# Patient Record
Sex: Female | Born: 1935 | Race: White | Hispanic: No | Smoking: Never smoker
Health system: Southern US, Community
[De-identification: ages and names within clinical notes are randomized; demographics above are authoritative.]

## PROBLEM LIST (undated history)

## (undated) DIAGNOSIS — N183 Chronic kidney disease, stage 3 unspecified: Secondary | ICD-10-CM

## (undated) DIAGNOSIS — R011 Cardiac murmur, unspecified: Secondary | ICD-10-CM

## (undated) DIAGNOSIS — I071 Rheumatic tricuspid insufficiency: Secondary | ICD-10-CM

## (undated) DIAGNOSIS — I10 Essential (primary) hypertension: Secondary | ICD-10-CM

## (undated) DIAGNOSIS — Z9889 Other specified postprocedural states: Secondary | ICD-10-CM

## (undated) DIAGNOSIS — F419 Anxiety disorder, unspecified: Secondary | ICD-10-CM

## (undated) DIAGNOSIS — I4891 Unspecified atrial fibrillation: Secondary | ICD-10-CM

## (undated) DIAGNOSIS — N189 Chronic kidney disease, unspecified: Secondary | ICD-10-CM

## (undated) DIAGNOSIS — R0602 Shortness of breath: Secondary | ICD-10-CM

## (undated) DIAGNOSIS — D649 Anemia, unspecified: Secondary | ICD-10-CM

## (undated) DIAGNOSIS — I272 Pulmonary hypertension, unspecified: Secondary | ICD-10-CM

## (undated) DIAGNOSIS — I447 Left bundle-branch block, unspecified: Secondary | ICD-10-CM

## (undated) DIAGNOSIS — E785 Hyperlipidemia, unspecified: Secondary | ICD-10-CM

## (undated) DIAGNOSIS — I340 Nonrheumatic mitral (valve) insufficiency: Secondary | ICD-10-CM

## (undated) DIAGNOSIS — I509 Heart failure, unspecified: Secondary | ICD-10-CM

## (undated) DIAGNOSIS — Z9981 Dependence on supplemental oxygen: Secondary | ICD-10-CM

## (undated) DIAGNOSIS — M1990 Unspecified osteoarthritis, unspecified site: Secondary | ICD-10-CM

## (undated) DIAGNOSIS — K580 Irritable bowel syndrome with diarrhea: Secondary | ICD-10-CM

## (undated) DIAGNOSIS — Z8679 Personal history of other diseases of the circulatory system: Secondary | ICD-10-CM

## (undated) HISTORY — DX: Pulmonary hypertension, unspecified: I27.20

## (undated) HISTORY — PX: TOTAL ABDOMINAL HYSTERECTOMY W/ BILATERAL SALPINGOOPHORECTOMY: SHX83

## (undated) HISTORY — DX: Rheumatic tricuspid insufficiency: I07.1

## (undated) HISTORY — DX: Anemia, unspecified: D64.9

## (undated) HISTORY — DX: Nonrheumatic mitral (valve) insufficiency: I34.0

## (undated) HISTORY — DX: Hyperlipidemia, unspecified: E78.5

## (undated) HISTORY — DX: Chronic kidney disease, unspecified: N18.9

## (undated) HISTORY — DX: Left bundle-branch block, unspecified: I44.7

## (undated) HISTORY — PX: TONSILLECTOMY: SUR1361

## (undated) HISTORY — DX: Essential (primary) hypertension: I10

## (undated) HISTORY — DX: Heart failure, unspecified: I50.9

## (undated) HISTORY — PX: ABDOMINAL HYSTERECTOMY: SHX81

## (undated) HISTORY — PX: BLADDER SUSPENSION: SHX72

---

## 2002-10-16 HISTORY — PX: REPLACEMENT TOTAL KNEE: SUR1224

## 2005-10-16 HISTORY — PX: JOINT REPLACEMENT: SHX530

## 2011-04-14 DIAGNOSIS — I059 Rheumatic mitral valve disease, unspecified: Secondary | ICD-10-CM

## 2011-10-17 HISTORY — PX: CARDIAC CATHETERIZATION: SHX172

## 2011-10-30 DIAGNOSIS — I4891 Unspecified atrial fibrillation: Secondary | ICD-10-CM

## 2011-10-30 DIAGNOSIS — I059 Rheumatic mitral valve disease, unspecified: Secondary | ICD-10-CM

## 2011-10-31 DIAGNOSIS — I059 Rheumatic mitral valve disease, unspecified: Secondary | ICD-10-CM

## 2011-10-31 DIAGNOSIS — I4891 Unspecified atrial fibrillation: Secondary | ICD-10-CM

## 2011-11-01 DIAGNOSIS — I059 Rheumatic mitral valve disease, unspecified: Secondary | ICD-10-CM

## 2011-11-01 DIAGNOSIS — I079 Rheumatic tricuspid valve disease, unspecified: Secondary | ICD-10-CM

## 2011-11-13 ENCOUNTER — Encounter (HOSPITAL_COMMUNITY): Payer: Self-pay | Admitting: Dentistry

## 2011-11-13 ENCOUNTER — Ambulatory Visit (HOSPITAL_COMMUNITY): Payer: Medicare Other | Admitting: Dentistry

## 2011-11-13 DIAGNOSIS — K08109 Complete loss of teeth, unspecified cause, unspecified class: Secondary | ICD-10-CM

## 2011-11-13 DIAGNOSIS — I340 Nonrheumatic mitral (valve) insufficiency: Secondary | ICD-10-CM | POA: Insufficient documentation

## 2011-11-13 DIAGNOSIS — I059 Rheumatic mitral valve disease, unspecified: Secondary | ICD-10-CM

## 2011-11-13 DIAGNOSIS — Z954 Presence of other heart-valve replacement: Secondary | ICD-10-CM

## 2011-11-13 DIAGNOSIS — M264 Malocclusion, unspecified: Secondary | ICD-10-CM

## 2011-11-13 DIAGNOSIS — I071 Rheumatic tricuspid insufficiency: Secondary | ICD-10-CM | POA: Insufficient documentation

## 2011-11-13 DIAGNOSIS — K036 Deposits [accretions] on teeth: Secondary | ICD-10-CM

## 2011-11-13 DIAGNOSIS — I079 Rheumatic tricuspid valve disease, unspecified: Secondary | ICD-10-CM

## 2011-11-13 NOTE — Progress Notes (Signed)
DENTAL CONSULTATION  Date of Consultation:  11/13/2011 Patient Name:   Tara Silva Date of Birth:   06-13-1934 Medical Record Number: [id number]  VITALS: BP 127/73  Pulse 68  Temp 97 F (36.1 C)   CHIEF COMPLAINT: Patient needs a pre-heart valve surgery dental protocol evaluation.    HPI: Tara Silva is a 78 year old female referred by Dr. Tressie Stalker for a dental consultation. Patient recently diagnosed with severe mitral regurgitation, moderate to severe tricuspid regurgitation, and chronic atrial fibrillation. Patient with anticipated heart valve surgery repair or replacement as indicated along with possible Maze procedure. Patient now seen as part of a pre-heart valve surgery dental protocol evaluation to rule out dental infection that may affect the patient's systemic health and anticipated heart valve surgery.  Patient currently denies acute toothache, swellings, or abscesses. She was last seen by dentist in [facility] (unknown name) for a dental extraction. Patient denies any complications from that dental extraction. Patient had several fillings done at that time.  Patient has no partial dentures. Patient" does not like dentists" .    PMH: Past Medical History  Diagnosis Date  . Valvular disease     Severe Mitral Regurgitation; Tricuspid Regurgitation; Anticipted mitral valve and trucuspid valve repairs in the near future with Dr. Cornelius Moras  . Arrhythmia     Chronic, Persistent Atrial fibrillation  . Congestive heart failure (CHF)   . Pulmonary hypertension   . Hypertension   . Left bundle branch block   . Diabetes mellitus     Type 2  . Anemia     chronic  . Chronic kidney disease     chronic renal insufficiency  . Hyperlipidemia     PSH: Past Surgical History  Procedure Date  . Replacement total knee 2004    Right knee  . Bladder suspension   . Tonsillectomy   . Total abdominal hysterectomy w/ bilateral salpingoophorectomy     ALLERGIES: Allergies    Allergen Reactions  . Aspirin Palpitations  . Sudafed (Pseudoephedrine Hcl) Other (See Comments)    "Scalp crawls"    MEDICATIONS: Current Outpatient Prescriptions  Medication Sig Dispense Refill  . amiodarone (PACERONE) 100 MG tablet Take 100 mg by mouth 2 (two) times daily.       Marland Kitchen atorvastatin (LIPITOR) 40 MG tablet Take 40 mg by mouth at bedtime.       Marland Kitchen b complex vitamins tablet Take 1 tablet by mouth daily.      Marland Kitchen dicyclomine (BENTYL) 20 MG tablet Take 20 mg by mouth every 12 (twelve) hours as needed.       . digoxin (LANOXIN) 0.125 MG tablet Take 125 mcg by mouth daily.      . furosemide (LASIX) 20 MG tablet Take 20 mg by mouth daily.      Marland Kitchen gemfibrozil (LOPID) 600 MG tablet Take 600 mg by mouth 2 (two) times daily before a meal.      . losartan (COZAAR) 100 MG tablet Take 50 mg by mouth daily.       . metoprolol succinate (TOPROL-XL) 100 MG 24 hr tablet Take 100 mg by mouth daily. Take with or immediately following a meal.      . metoprolol succinate (TOPROL-XL) 25 MG 24 hr tablet Take 25 mg by mouth at bedtime.      . polysaccharide iron (NIFEREX) 150 MG CAPS capsule Take 150 mg by mouth daily.      . promethazine (PHENERGAN) 12.5 MG tablet Take 12.5 mg  by mouth as needed. For nausea.      . metFORMIN (GLUCOPHAGE) 1000 MG tablet Take 1,000 mg by mouth.      . spironolactone (ALDACTONE) 25 MG tablet Take 12.5 mg by mouth 2 (two) times daily.       . traMADol (ULTRAM) 50 MG tablet Take 50 mg by mouth every 6 (six) hours as needed.        LABS: No results found for this basename: WBC, HGB, HCT, MCV, PLT   No results found for this basename: na, k, cl, co2, glucose, bun, creatinine, calcium, gfrnonaa, gfraa     SOCIAL HISTORY: History   Social History  . Marital Status: Widowed    Spouse Name: N/A    Number of Children: N/A  . Years of Education: N/A   Occupational History  . Not on file.   Social History Main Topics  . Smoking status: Former Smoker -- 0.5  packs/day for 2 years    Types: Cigarettes    Quit date: 10/17/1923  . Smokeless tobacco: Former Neurosurgeon    Types: Chew    Quit date: 10/16/1990  . Alcohol Use: No  . Drug Use: No  . Sexually Active:    Other Topics Concern  . Not on file   Social History Narrative  . No narrative on file    FAMILY HISTORY: Mother died at age 24 with a heart attack. Father died at the age of 90 from a stroke.   REVIEW OF SYSTEMS: Reviewed from Dr. Orvan July note dated 10/30/2011 from [facility].  DENTAL HISTORY: CHIEF COMPLAINT: Patient needs a pre-heart valve surgery dental protocol evaluation.    HPI: Tara Silva is a 78 year old female referred by Dr. Tressie Stalker for a dental consultation. Patient recently diagnosed with severe mitral regurgitation, moderate to severe tricuspid regurgitation, and chronic atrial fibrillation. Patient with anticipated heart valve surgery repair or replacement as indicated along with possible Maze procedure. Patient now seen as part of a pre-heart valve surgery dental protocol evaluation to rule out dental infection that may affect the patient's systemic health and anticipated heart valve surgery.  Patient currently denies acute toothache, swellings, or abscesses. She was last seen by dentist in [facility] (unknown name) for a dental extraction. Patient denies any complications from that dental extraction. Patient had several fillings done at that time.  Patient has no partial dentures. Patient" does not like dentists" .  DENTAL EXAMINATION:  GENERAL: Patient is a well-developed, slightly build female in no acute distress. HEAD AND NECK: There is no obvious submandibular lymphadenopathy. The patient denies acute TMJ symptoms. Patient does have left TMJ crepitus. INTRAORAL EXAM: Patient has incipient xerostomia. There is no evidence of abscess formation. DENTITION: Patient with multiple missing teeth numbers 1, 2, 4, 13, 14, 16, 18, 19,  30, and 32. PERIODONTAL: Patient with chronic periodontitis with plaque and calculus accumulations, generalized gingival recession, and incipient tooth mobility of multiple teeth as charted. DENTAL CARIES/SUBOPTIMAL RESTORATIONS: Patient has dental caries affect and tooth #17. The caries are very close to the pulp. ENDODONTIC: Patient denies acute pulpitis symptoms. Do not see any evidence of periapical pathology . CROWN AND BRIDGE: Patient has crown on tooth #3 that appears to be acceptable. PROSTHODONTIC: Patient denies presents a partial dentures. OCCLUSION: Patient has a poor occlusal scheme but a stable occlusion at this time. Patient has supra-eruption and drifting of the unopposed teeth into the edentulous areas.  RADIOGRAPHIC INTERPRETATION: A panoramic x-ray  was taken and supplemented with 13 periapical radiographs and to bitewings. There are multiple missing teeth. There is supra-eruption and drifting of the unopposed teeth into the edentulous areas. There is radiographic calculus noted. There are dental caries noted be affect and tooth #17.   ASSESSMENTS: 1. Chronic periodontitis with accretions. 2. Extensive dental caries affecting tooth #17 with close proximity to the pulp. 3. Moderate bone loss 4. Generalized gingival recession 5. No history of partial dentures 6. Supra-eruption and drifting of the unopposed teeth into the edentulous areas. 7. Poor occlusal scheme 8. Xerostomia 9. Significant cardiovascular compromise with potential risks for complications with invasive dental procedures 10 . Need to determine the need for antibiotic premedication prior to invasive dental procedures due to heart valve disease as well as history of total knee replacement.  PLAN/RECOMMENDATIONS: 1. I discussed the risks, benefits, and complications of various treatment options with the patient in relationship to the medical and dental conditions. We discussed various treatment options to include  no treatment, singular or multiple extractions with alveoloplasty, pre-prosthetic surgery as indicated, periodontal therapy, dental restorations, root canal therapy, crown and bridge therapy, implant therapy, and replacement of missing teeth as indicated. The patient currently wishes to proceed with referral to an Oral Surgeon for extraction of tooth #17 in [place], [place]. Patient will then return for periodontal therapy with Dental medicine. 2. Discussion of findings with medical and dental team and coordination of future medical and dental care.   Charlynne Pander, DDS

## 2011-11-20 ENCOUNTER — Encounter (HOSPITAL_COMMUNITY): Payer: Self-pay | Admitting: Dentistry

## 2011-11-20 ENCOUNTER — Ambulatory Visit (HOSPITAL_COMMUNITY): Payer: Self-pay | Admitting: Dentistry

## 2011-11-20 DIAGNOSIS — K036 Deposits [accretions] on teeth: Secondary | ICD-10-CM

## 2011-11-20 DIAGNOSIS — K0530 Chronic periodontitis, unspecified: Secondary | ICD-10-CM

## 2011-11-20 NOTE — Progress Notes (Signed)
Monday, November 20, 2011   BP: 135/77             P:  68           T: 97.1  Tara Silva presents for gross debridement of remaining teeth. Patient forgot to take antibiotic premedication. Patient is scheduled for extraction tomorrow with Dr. Beather Arbour. Patient to call their office and make sure what antibiotics she is to take for the appointment. Patient rescheduled for gross debridement next Tuesday with amoxicillin premedication.  Dr. Cindra Eves

## 2011-11-28 ENCOUNTER — Ambulatory Visit (HOSPITAL_COMMUNITY): Payer: Self-pay | Admitting: Dentistry

## 2011-11-28 DIAGNOSIS — K0530 Chronic periodontitis, unspecified: Secondary | ICD-10-CM

## 2011-11-28 DIAGNOSIS — K036 Deposits [accretions] on teeth: Secondary | ICD-10-CM

## 2011-11-28 DIAGNOSIS — K08199 Complete loss of teeth due to other specified cause, unspecified class: Secondary | ICD-10-CM

## 2011-11-28 NOTE — Progress Notes (Signed)
Tuesday, November 28, 2011   BP: 143/67          P: 71           T: 97.1  Tara Silva present for gross debridement of remaining teeth prior to anticipated mitral valve replacement. Patient had tooth #17 extracted with the oral surgeon on Tuesday, 11/21/2011. This was with Dr. Beather Arbour. Patient denies any problems with the extraction site. Patient denies having any sutures remaining. Patient took amoxicillin 2.0 g 1 hour prior to dental appointment.  Exam: Patient with evidence of gross plaque and calculus accumulations. Extraction site #17 is healing in well. Clot is present. No sutures remain. No signs of oral infection.  Procedure: Antibiotic verification form filled out. Patient agrees to proceed with gross debridement procedure today. Gross debridement with sonic scaler. Selective hand curettes were utilized to remove further accretions. Sonic scaler then again used to refine removal of accretions. Oral hygiene instructions were provided. Teeth were polished. Teeth were flossed. Postop rinse with chlorhexidine for 30 seconds. Patient tolerated procedure well. Patient is now cleared for heart valve surgery. Patient is to follow up with Dr. Laneta Simmers for evaluation for anticipated heart valve surgery. Patient dismissed in stable condition to the care of her daughter-in-law.   Dr. Cindra Eves

## 2011-12-06 ENCOUNTER — Ambulatory Visit (INDEPENDENT_AMBULATORY_CARE_PROVIDER_SITE_OTHER): Payer: Medicare Other | Admitting: Surgery

## 2011-12-06 DIAGNOSIS — I38 Endocarditis, valve unspecified: Secondary | ICD-10-CM

## 2011-12-07 ENCOUNTER — Encounter: Payer: Self-pay | Admitting: Surgery

## 2011-12-07 NOTE — Progress Notes (Signed)
Patient ID: Tara Silva, female   DOB: 06-28-1934, 78 y.o.   MRN: [id number]   I reviewed her chart and prior consult note from Dr. Cornelius Moras, as well as her echo and cath.  I agree with Dr. Orvan July assessment that she needs MV repair or replacement, TV repair, MAZE and probably LV epicardial pacing leads.  She is a high risk surgical case due to biventricular dysfunction and chronic renal disease but says she feels fairly good at this time and would like to proceed with surgery while she feels well.  I told her that I thought she should have the surgery done at [facility] by Dr. Cornelius Moras who has worked the patient up thoroughly already.  She will benefit from the ability to do CVVHD if she has kidney failure, pulmonary/CCM support if needed, ability to use NO, and availability of LV/RV support device if needed. I discussed all of this with the patient and her daughter and they both agree with returning to see Dr. Cornelius Moras in the office and having surgery at [facility].

## 2011-12-18 ENCOUNTER — Encounter: Payer: Self-pay | Admitting: Thoracic Surgery (Cardiothoracic Vascular Surgery)

## 2011-12-18 ENCOUNTER — Ambulatory Visit (INDEPENDENT_AMBULATORY_CARE_PROVIDER_SITE_OTHER): Payer: Medicare Other | Admitting: Thoracic Surgery (Cardiothoracic Vascular Surgery)

## 2011-12-18 DIAGNOSIS — I059 Rheumatic mitral valve disease, unspecified: Secondary | ICD-10-CM

## 2011-12-18 DIAGNOSIS — I509 Heart failure, unspecified: Secondary | ICD-10-CM

## 2011-12-18 DIAGNOSIS — I079 Rheumatic tricuspid valve disease, unspecified: Secondary | ICD-10-CM

## 2011-12-18 DIAGNOSIS — I4891 Unspecified atrial fibrillation: Secondary | ICD-10-CM

## 2011-12-18 DIAGNOSIS — D649 Anemia, unspecified: Secondary | ICD-10-CM | POA: Insufficient documentation

## 2011-12-18 DIAGNOSIS — I272 Pulmonary hypertension, unspecified: Secondary | ICD-10-CM | POA: Insufficient documentation

## 2011-12-18 DIAGNOSIS — I071 Rheumatic tricuspid insufficiency: Secondary | ICD-10-CM

## 2011-12-18 DIAGNOSIS — I482 Chronic atrial fibrillation, unspecified: Secondary | ICD-10-CM | POA: Insufficient documentation

## 2011-12-18 DIAGNOSIS — I340 Nonrheumatic mitral (valve) insufficiency: Secondary | ICD-10-CM

## 2011-12-18 DIAGNOSIS — E119 Type 2 diabetes mellitus without complications: Secondary | ICD-10-CM | POA: Insufficient documentation

## 2011-12-18 DIAGNOSIS — I447 Left bundle-branch block, unspecified: Secondary | ICD-10-CM

## 2011-12-18 DIAGNOSIS — I2789 Other specified pulmonary heart diseases: Secondary | ICD-10-CM

## 2011-12-18 DIAGNOSIS — N189 Chronic kidney disease, unspecified: Secondary | ICD-10-CM | POA: Insufficient documentation

## 2011-12-18 NOTE — Progress Notes (Signed)
[address]            [name] [address]          [phone]     CARDIOTHORACIC SURGERY CONSULTATION REPORT  PCP is Jolene Provost, MD, MD Referring Provider is Merrily Pew, MD  Chief Complaint  Patient presents with  . Follow-up    Further discuss surgery    HPI:  Patient is 78 year old recently widowed white female from [place] with long-standing history of mitral regurgitation, atrial fibrillation, and congestive heart failure. Patient states that she was first noted to have an irregular heart rhythm at least 8 years ago when she presented with severe anemia and congestive heart failure. In May of 2009 she was hospitalized with congestive heart failure and rapid atrial fibrillation. Echocardiogram performed at that time confirming the presence of mitral regurgitation with normal left ventricular systolic function. Since then the patient has developed progressive worsening symptoms of exertional shortness of breath and fatigue. In June 2012 she was hospitalized in [facility] with class IV congestive heart failure and respiratory failure. Echocardiogram performed at that time demonstrated severe global left ventricular dysfunction with severe mitral regurgitation and moderate tricuspid regurgitation. The patient underwent left and right heart catheterization demonstrating normal coronary artery anatomy with no significant coronary artery disease. The patient was seen in consultation at that time by Dr. Erby Pian and told that she probably would not tolerate surgery under the circumstances. The patient was treated medically and did reasonably well for a period of time, but she was rehospitalized in January 2013 with rapid atrial fibrillation class 4 congestive heart failure and pulmonary edema. I had the opportunity to see the patient in consultation on 10/30/2011 during that hospitalization. She had transesophageal echocardiogram performed on  11/01/2011. This revealed severe left ventricular dysfunction with severe mitral regurgitation and moderate to severe tricuspid regurgitation. Right heart catheterization was performed at that time demonstrating pulmonary artery pressure 33/17 with pulmonary catheter wedge pressure mean of 15 and large V waves. Cardiac output ranged between 3.1 and 3.4 L per minute corresponding to a cardiac index between 1.9 and 2.0. Mixed venous oxygen saturation was 55%. Central venous pressure was 5. Since then the patient has remained clinically stable from a cardiovascular standpoint. She underwent dental extraction a dental cleaning. She now desires to proceed with elective surgery.  Past Medical History  Diagnosis Date  . Congestive heart failure (CHF)     chronic systolic and diastolic  . Pulmonary hypertension   . Hypertension   . Left bundle branch block   . Diabetes mellitus     Type 2  . Anemia     chronic  . Chronic kidney disease     chronic renal insufficiency  . Hyperlipidemia   . Mitral regurgitation   . Tricuspid regurgitation   . Atrial fibrillation, chronic   . CHF (congestive heart failure) 12/18/2011    Past Surgical History  Procedure Date  . Replacement total knee 2004    Right knee  . Bladder suspension   . Tonsillectomy   . Total abdominal hysterectomy w/ bilateral salpingoophorectomy     History reviewed. No pertinent family history.  Social History History  Substance Use Topics  . Smoking status: Former Smoker -- 0.5 packs/day for 2 years    Types: Cigarettes    Quit date: 10/17/1923  . Smokeless tobacco: Former Neurosurgeon  Types: Dorna Bloom    Quit date: 10/16/1990  . Alcohol Use: No    Current Outpatient Prescriptions  Medication Sig Dispense Refill  . ALPRAZolam (XANAX) 0.25 MG tablet Take 0.25 mg by mouth at bedtime as needed.       Marland Kitchen amiodarone (PACERONE) 100 MG tablet Take 200 mg by mouth daily.       Marland Kitchen atorvastatin (LIPITOR) 40 MG tablet Take 40 mg by mouth at  bedtime.       Marland Kitchen b complex vitamins tablet Take 1 tablet by mouth daily.      Marland Kitchen dicyclomine (BENTYL) 20 MG tablet Take 20 mg by mouth every 12 (twelve) hours as needed.       . digoxin (LANOXIN) 0.125 MG tablet Take 125 mcg by mouth daily.      . furosemide (LASIX) 20 MG tablet Take 20 mg by mouth daily.      Marland Kitchen gemfibrozil (LOPID) 600 MG tablet Take 600 mg by mouth 2 (two) times daily before a meal.      . losartan (COZAAR) 100 MG tablet Take 50 mg by mouth daily.       . metoprolol succinate (TOPROL-XL) 100 MG 24 hr tablet Take 100 mg by mouth daily. Take with or immediately following a meal.      . metoprolol succinate (TOPROL-XL) 25 MG 24 hr tablet Take 25 mg by mouth at bedtime.      . polysaccharide iron (NIFEREX) 150 MG CAPS capsule Take 150 mg by mouth daily.      . promethazine (PHENERGAN) 12.5 MG tablet Take 12.5 mg by mouth as needed. For nausea.      . traMADol (ULTRAM) 50 MG tablet Take 50 mg by mouth every 6 (six) hours as needed.        Allergies  Allergen Reactions  . Aspirin Palpitations  . Sudafed (Pseudoephedrine Hcl) Other (See Comments)    "Scalp crawls"    Review of Systems:  General:  decreased appetite, normal energy   Respiratory:  no cough, no wheezing, no hemoptysis, no pain with inspiration or cough, no shortness of breath  Cardiac:   no chest pain or tightness, + exertional SOB, no resting SOB, no PND, + orthopnea, no LE edema, no palpitations, no syncope  GI:   no difficulty swallowing, no hematochezia, no hematemesis, no melena, + constipation, no diarrhea   GU:   no dysuria, no urgency, + frequency   Musculoskeletal: + arthritis, mild, ambulates well without assistance  Vascular:  no pain suggestive of claudication   Neuro:   no symptoms suggestive of TIA's, no seizures, no headaches, no peripheral neuropathy   Endocrine:  Negative, blood sugars reportedly well controlled  HEENT:  no loose teeth or painful teeth,  no recent vision changes  Psych:   +  anxiety, no depression    Physical Exam:   BP 158/71  Pulse 71  Resp 18  Ht 5\' 2"  (1.575 m)  Wt 149 lb (67.586 kg)  BMI 27.25 kg/m2  SpO2 99%  General:    well-appearing  HEENT:  Unremarkable   Neck:   no JVD, no bruits, no adenopathy   Chest:   clear to auscultation, symmetrical breath sounds, no wheezes, no rhonchi   CV:   RRR, grade II/VI systolic murmur   Abdomen:  soft, non-tender, no masses   Extremities:  warm, well-perfused, pulses diminished, no LE edema  Rectal/GU  Deferred  Neuro:   Grossly non-focal and symmetrical throughout  Skin:  Clean and dry, no rashes, no breakdown   Diagnostic Tests:  n/a   Impression:  Patient has severe mitral regurgitation with long-standing chronic persistent atrial fibrillation and chronic systolic and diastolic congestive heart failure. Left ventricular function with moderate to severely impaired. The patient has pulmonary a tension as well as moderate to severe tricuspid regurgitation. She has been remarkably stable over the past 6 weeks since her most recent hospitalization.  Plan:  The rationale for elective mitral valve repair surgery has been explained, including a comparison between surgery and continued medical therapy with close follow-up.  The likelihood of successful and durable valve repair has been discussed with particular reference to the findings of their recent echocardiogram.  Based upon these findings and previous experience, I have quoted them a greater than 80 percent likelihood of successful valve repair.  In the unlikely event that their valve cannot be successfully repaired, we discussed the possibility of replacing the mitral valve using a mechanical prosthesis with the attendant need for long-term anticoagulation versus the alternative of replacing it using a bioprosthetic tissue valve with its potential for late structural valve deterioration and failure, depending upon the patient's longevity.  The patient  specifically requests that if the mitral valve must be replaced that it be done using a bioprosthetic tissue valve.  The need for tricuspid valve repair has also been discussed, as has the rationale for maze procedure and placement of LV epicardial permanent pacemaker leads as been reviewed.  The patient understands and accepts all potential associated risks of surgery including but not limited to risk of death, stroke, myocardial infarction, congestive heart failure, respiratory failure, renal failure, bleeding requiring blood transfusion and/or reexploration, arrhythmia, heart block or bradycardia requiring permanent pacemaker, pneumonia, pleural effusion, wound infection, pulmonary embolus or other thromboembolic complication, chronic pain or other delayed complications.  All questions answered.  We tentatively plan for surgery on Thursday January 04, 2012.  We will see her back here in the office on Monday January 01, 2012.     Salvatore Decent. Cornelius Moras, MD 12/18/2011 4:26 PM

## 2011-12-19 ENCOUNTER — Other Ambulatory Visit: Payer: Self-pay | Admitting: Thoracic Surgery (Cardiothoracic Vascular Surgery)

## 2011-12-19 ENCOUNTER — Other Ambulatory Visit: Payer: Self-pay

## 2011-12-19 DIAGNOSIS — I079 Rheumatic tricuspid valve disease, unspecified: Secondary | ICD-10-CM

## 2011-12-19 DIAGNOSIS — I4891 Unspecified atrial fibrillation: Secondary | ICD-10-CM

## 2011-12-19 DIAGNOSIS — I059 Rheumatic mitral valve disease, unspecified: Secondary | ICD-10-CM

## 2011-12-29 ENCOUNTER — Encounter (HOSPITAL_COMMUNITY): Payer: Self-pay | Admitting: Pharmacy Technician

## 2011-12-29 ENCOUNTER — Other Ambulatory Visit (HOSPITAL_COMMUNITY): Payer: Self-pay | Admitting: *Deleted

## 2012-01-01 ENCOUNTER — Encounter: Payer: Self-pay | Admitting: Thoracic Surgery (Cardiothoracic Vascular Surgery)

## 2012-01-01 ENCOUNTER — Encounter (HOSPITAL_COMMUNITY): Payer: Self-pay

## 2012-01-01 ENCOUNTER — Ambulatory Visit (HOSPITAL_COMMUNITY)
Admission: RE | Admit: 2012-01-01 | Discharge: 2012-01-01 | Disposition: A | Discharge status: Home / Self Care | Payer: Medicare Other | Source: Ambulatory Visit | Attending: Thoracic Surgery (Cardiothoracic Vascular Surgery) | Admitting: Thoracic Surgery (Cardiothoracic Vascular Surgery)

## 2012-01-01 ENCOUNTER — Encounter (HOSPITAL_COMMUNITY)
Admission: RE | Admit: 2012-01-01 | Discharge: 2012-01-01 | Disposition: A | Discharge status: Home / Self Care | Payer: Medicare Other | Source: Ambulatory Visit | Attending: Thoracic Surgery (Cardiothoracic Vascular Surgery) | Admitting: Thoracic Surgery (Cardiothoracic Vascular Surgery)

## 2012-01-01 ENCOUNTER — Ambulatory Visit (INDEPENDENT_AMBULATORY_CARE_PROVIDER_SITE_OTHER): Payer: Medicare Other | Admitting: Thoracic Surgery (Cardiothoracic Vascular Surgery)

## 2012-01-01 ENCOUNTER — Inpatient Hospital Stay (HOSPITAL_COMMUNITY)
Admission: RE | Admit: 2012-01-01 | Discharge: 2012-01-01 | Disposition: A | Discharge status: Home / Self Care | Payer: Medicare Other | Source: Ambulatory Visit | Attending: Thoracic Surgery (Cardiothoracic Vascular Surgery) | Admitting: Thoracic Surgery (Cardiothoracic Vascular Surgery)

## 2012-01-01 ENCOUNTER — Other Ambulatory Visit: Payer: Self-pay

## 2012-01-01 ENCOUNTER — Other Ambulatory Visit (HOSPITAL_COMMUNITY): Payer: Self-pay

## 2012-01-01 VITALS — HR 57 | Temp 96.7°F | Resp 20 | Ht 62.0 in | Wt 147.5 lb

## 2012-01-01 VITALS — BP 145/78 | HR 64 | Resp 18 | Ht 62.0 in | Wt 147.0 lb

## 2012-01-01 DIAGNOSIS — I059 Rheumatic mitral valve disease, unspecified: Secondary | ICD-10-CM

## 2012-01-01 DIAGNOSIS — I079 Rheumatic tricuspid valve disease, unspecified: Secondary | ICD-10-CM

## 2012-01-01 DIAGNOSIS — I4891 Unspecified atrial fibrillation: Secondary | ICD-10-CM | POA: Insufficient documentation

## 2012-01-01 DIAGNOSIS — Z01812 Encounter for preprocedural laboratory examination: Secondary | ICD-10-CM | POA: Insufficient documentation

## 2012-01-01 DIAGNOSIS — I2789 Other specified pulmonary heart diseases: Secondary | ICD-10-CM

## 2012-01-01 DIAGNOSIS — Z01818 Encounter for other preprocedural examination: Secondary | ICD-10-CM | POA: Insufficient documentation

## 2012-01-01 DIAGNOSIS — I509 Heart failure, unspecified: Secondary | ICD-10-CM | POA: Insufficient documentation

## 2012-01-01 DIAGNOSIS — Z01810 Encounter for preprocedural cardiovascular examination: Secondary | ICD-10-CM

## 2012-01-01 DIAGNOSIS — I447 Left bundle-branch block, unspecified: Secondary | ICD-10-CM

## 2012-01-01 HISTORY — DX: Dependence on supplemental oxygen: Z99.81

## 2012-01-01 HISTORY — DX: Anxiety disorder, unspecified: F41.9

## 2012-01-01 HISTORY — DX: Shortness of breath: R06.02

## 2012-01-01 HISTORY — DX: Unspecified osteoarthritis, unspecified site: M19.90

## 2012-01-01 HISTORY — DX: Unspecified atrial fibrillation: I48.91

## 2012-01-01 HISTORY — DX: Chronic kidney disease, stage 3 (moderate): N18.3

## 2012-01-01 HISTORY — DX: Cardiac murmur, unspecified: R01.1

## 2012-01-01 HISTORY — DX: Chronic kidney disease, stage 3 unspecified: N18.30

## 2012-01-01 HISTORY — DX: Irritable bowel syndrome with diarrhea: K58.0

## 2012-01-01 LAB — COMPREHENSIVE METABOLIC PANEL
ALT: 10 U/L (ref 0–35)
AST: 15 U/L (ref 0–37)
Albumin: 4.1 g/dL (ref 3.5–5.2)
Calcium: 9.8 mg/dL (ref 8.4–10.5)
Chloride: 101 mEq/L (ref 96–112)
Creatinine, Ser: 1.02 mg/dL (ref 0.50–1.10)
Sodium: 136 mEq/L (ref 135–145)

## 2012-01-01 LAB — BLOOD GAS, ARTERIAL
Acid-base deficit: 1.1 mmol/L (ref 0.0–2.0)
Drawn by: 206361
FIO2: 0.21 %
pCO2 arterial: 40.1 mmHg (ref 35.0–45.0)
pO2, Arterial: 88.5 mmHg (ref 80.0–100.0)

## 2012-01-01 LAB — CBC
MCH: 31.5 pg (ref 26.0–34.0)
MCV: 95.6 fL (ref 78.0–100.0)
Platelets: 221 10*3/uL (ref 150–400)
RBC: 3.87 MIL/uL (ref 3.87–5.11)
RDW: 13 % (ref 11.5–15.5)
WBC: 10.8 10*3/uL — ABNORMAL HIGH (ref 4.0–10.5)

## 2012-01-01 LAB — SURGICAL PCR SCREEN: MRSA, PCR: NEGATIVE

## 2012-01-01 LAB — URINALYSIS, ROUTINE W REFLEX MICROSCOPIC
Bilirubin Urine: NEGATIVE
Glucose, UA: NEGATIVE mg/dL
Hgb urine dipstick: NEGATIVE
Ketones, ur: NEGATIVE mg/dL
Protein, ur: NEGATIVE mg/dL

## 2012-01-01 LAB — APTT: aPTT: 36 seconds (ref 24–37)

## 2012-01-01 LAB — PROTIME-INR: Prothrombin Time: 14.7 seconds (ref 11.6–15.2)

## 2012-01-01 NOTE — Pre-Procedure Instructions (Signed)
[address] ELIF YONTS  01/01/2012   Your procedure is scheduled on:  Thursday, March 21  Report to [facility] at 0530 AM.  Call this number if you have problems the morning of surgery: [phone]   Remember:   Do not eat food:After Midnight.  May have clear liquids: up to 4 Hours before arrival.  Clear liquids include soda, tea, black coffee, apple or grape juice, broth.  Take these medicines the morning of surgery with A SIP OF WATER: *Digoxin, Metoprolol**   Do not wear jewelry, make-up or nail polish.  Do not wear lotions, powders, or perfumes. You may wear deodorant.  Do not shave 48 hours prior to surgery.  Do not bring valuables to the hospital.  Contacts, dentures or bridgework may not be worn into surgery.  Leave suitcase in the car. After surgery it may be brought to your room.  For patients admitted to the hospital, checkout time is 11:00 AM the day of discharge.   Patients discharged the day of surgery will not be allowed to drive home.     Special Instructions: Incentive Spirometry - Practice and bring it with you on the day of surgery. and CHG Shower Use Special Wash: 1/2 bottle night before surgery and 1/2 bottle morning of surgery.   Please read over the following fact sheets that you were given: Pain Booklet, Coughing and Deep Breathing, Blood Transfusion Information, Open Heart Packet, MRSA Information and Surgical Site Infection Prevention

## 2012-01-01 NOTE — Consult Note (Signed)
Anesthesia:  Patient is a 78 year old female scheduled for MV repair/replacement, TV repair, Maze procedure, placement of epicardial PM leads on 01/04/12.  She has severe MR, afib, chronic systolic and diastolic CHF (EF 40%), moderately to severely impaired LV function, mild pulmonary HTN, and moderate - severe TR.  Other history includes HTN, former smoker, anemia, HLD, CKD stage III, left BBB, DM2, IBS, anxiety, SOB, arthritis, prior use of supplemental O2.  Her PCP is Dr. Dewain Penning.  Her Cardiologist is Dr. Merrily Pew with [facility].  Her EKG today shows afib at 65 bpm, left BBB, LAD.  Rate is slower, but overall stable since 10/26/11.    Heart caths were done at [facility] with results as follows (per Dr. Orvan July H&P note; copies on chart for review):  Left and right heart catheterization performed 04/14/2011 is reviewed. This demonstrates normal coronary artery anatomy with no significant coronary artery disease. Right heart catheterization at that time demonstrated pulmonary artery pressure 38/13 with pulmonary capillary wedge pressure 24 a large V waves. Central venous pressure was 13 with mixed venous oxygen saturation of only 46.5%. Cardiac output ranged between 3.7 3.9 L per minute. Repeat right heart catheterization was performed in January 2013 revealed pulmonary artery pressure 33/17 with pulmonary catheter wedge pressure mean of 15 and large V waves. Cardiac output ranged between 3.1 and 3.4 L per minute corresponding to a cardiac index between 1.9 and 2.0. Mixed venous oxygen saturation was 55%. Central venous pressure was 5.   TEE on 11/01/11 showed severe MR, mod-severe TR, normal LA appendage, mild spontaneous echo contrast was present in LA appendage, moderately dilated LA.  2D echo on 10/26/11 showed bi-atrial enlargement, LVH with mild global hypokinesis and sepal dyskinesis, LVEF 45%, severe MR, mod-severe TR, mild PR with mild pulmonary HTN, no  pericardial effusion.  CXR and labs acceptable from an Anesthesia standpoint.  Anticipate she can proceed as planned if no significant change in her status.

## 2012-01-01 NOTE — H&P (Signed)
CARDIOTHORACIC SURGERY HISTORY AND PHYSICAL EXAM  PCP is Jolene Provost, MD, MD Referring Provider is Merrily Pew, MD    Chief Complaint   Patient presents with   .  Follow-up       Further discuss surgery     HPI:  Patient is 78 year old recently widowed white female from [place] with long-standing history of mitral regurgitation, atrial fibrillation, and congestive heart failure. Patient states that she was first noted to have an irregular heart rhythm at least 8 years ago when she presented with severe anemia and congestive heart failure. In May of 2009 she was hospitalized with congestive heart failure and rapid atrial fibrillation. Echocardiogram performed at that time confirming the presence of mitral regurgitation with normal left ventricular systolic function. Since then the patient has developed progressive worsening symptoms of exertional shortness of breath and fatigue. In June 2012 she was hospitalized in [facility] with class IV congestive heart failure and respiratory failure. Echocardiogram performed at that time demonstrated severe global left ventricular dysfunction with severe mitral regurgitation and moderate tricuspid regurgitation. The patient underwent left and right heart catheterization demonstrating normal coronary artery anatomy with no significant coronary artery disease. The patient was seen in consultation at that time by Dr. Erby Pian and told that she probably would not tolerate surgery under the circumstances. The patient was treated medically and did reasonably well for a period of time, but she was rehospitalized in January 2013 with rapid atrial fibrillation class 4 congestive heart failure and pulmonary edema. I had the opportunity to see the patient in consultation on 10/30/2011 during that hospitalization. She had transesophageal echocardiogram performed on 11/01/2011. This revealed severe left ventricular dysfunction with severe mitral  regurgitation and moderate to severe tricuspid regurgitation. Right heart catheterization was performed at that time demonstrating pulmonary artery pressure 33/17 with pulmonary catheter wedge pressure mean of 15 and large V waves. Cardiac output ranged between 3.1 and 3.4 L per minute corresponding to a cardiac index between 1.9 and 2.0. Mixed venous oxygen saturation was 55%. Central venous pressure was 5. Since then the patient has remained clinically stable from a cardiovascular standpoint. She underwent dental extraction a dental cleaning. She now desires to proceed with elective surgery.   Past Medical History  Diagnosis Date  . Congestive heart failure (CHF)     chronic systolic and diastolic  . Pulmonary hypertension   . Hypertension   . Anemia     chronic  . Chronic kidney disease     chronic renal insufficiency  . Hyperlipidemia   . Mitral regurgitation   . Tricuspid regurgitation   . Atrial fibrillation, chronic   . CHF (congestive heart failure) 12/18/2011  . Heart murmur   . Shortness of breath   . Left bundle branch block   . Atrial fibrillation   . Diabetes mellitus     Type 2 Niddm X 15 years  . Chronic renal disease, stage III   . Irritable bowel syndrome with diarrhea   . Arthritis   . Anxiety   . Supplemental oxygen dependent     2 l @ night;nasal cannula    Past Surgical History  Procedure Date  . Replacement total knee 2004    Right knee  . Bladder suspension   . Tonsillectomy   . Total abdominal hysterectomy w/ bilateral salpingoophorectomy   . Joint replacement 2007    Right knee  . Cardiac catheterization 10/2011  . Abdominal hysterectomy  Family History  Problem Relation Age of Onset  . Anesthesia problems Neg Hx     Social History History  Substance Use Topics  . Smoking status: Former Smoker -- 0.5 packs/day for 2 years    Types: Cigarettes    Quit date: 10/17/1923  . Smokeless tobacco: Former Neurosurgeon    Types: Chew    Quit date:  10/16/1990  . Alcohol Use: No    No current facility-administered medications for this encounter.   Current Outpatient Prescriptions  Medication Sig Dispense Refill  . ALPRAZolam (XANAX) 0.25 MG tablet Take 0.25 mg by mouth 2 (two) times daily as needed. For sleep      . atorvastatin (LIPITOR) 40 MG tablet Take 40 mg by mouth at bedtime.       . dicyclomine (BENTYL) 20 MG tablet Take 20 mg by mouth 2 (two) times daily.       . digoxin (LANOXIN) 0.125 MG tablet Take 125 mcg by mouth daily.      . ferrous sulfate 325 (65 FE) MG tablet Take 325 mg by mouth daily with breakfast.      . furosemide (LASIX) 20 MG tablet Take 20 mg by mouth daily.      Marland Kitchen gemfibrozil (LOPID) 600 MG tablet Take 600 mg by mouth 2 (two) times daily before a meal.      . losartan (COZAAR) 100 MG tablet Take 50 mg by mouth daily.       . metoprolol succinate (TOPROL-XL) 100 MG 24 hr tablet Take 100 mg by mouth 2 (two) times daily. Take with or immediately following a meal.      . metoprolol succinate (TOPROL-XL) 25 MG 24 hr tablet Take 25 mg by mouth at bedtime.      . promethazine (PHENERGAN) 12.5 MG tablet Take 12.5 mg by mouth as needed. For nausea.      . saxagliptin HCl (ONGLYZA) 5 MG TABS tablet Take 5 mg by mouth daily.      . traMADol (ULTRAM) 50 MG tablet Take 50 mg by mouth 2 (two) times daily as needed. For pain      . vitamin B-12 (CYANOCOBALAMIN) 500 MCG tablet Take 500 mcg by mouth 2 (two) times daily.        Allergies  Allergen Reactions  . Aspirin Palpitations  . Sudafed (Pseudoephedrine Hcl) Other (See Comments)    "Scalp crawls"   Review of Systems:             General:                      decreased appetite, normal energy               Respiratory:                no cough, no wheezing, no hemoptysis, no pain with inspiration or cough, no shortness of breath             Cardiac:                       no chest pain or tightness, + exertional SOB, no resting SOB, no PND, + orthopnea, no LE edema,  no palpitations, no syncope             GI:  no difficulty swallowing, no hematochezia, no hematemesis, no melena, + constipation, no diarrhea               GU:                              no dysuria, no urgency, + frequency               Musculoskeletal:         + arthritis, mild, ambulates well without assistance             Vascular:                     no pain suggestive of claudication               Neuro:                         no symptoms suggestive of TIA's, no seizures, no headaches, no peripheral neuropathy               Endocrine:                   Negative, blood sugars reportedly well controlled             HEENT:                       no loose teeth or painful teeth,  no recent vision changes             Psych:                         + anxiety, no depression                Physical Exam:              BP 158/71  Pulse 71  Resp 18  Ht 5\' 2"  (1.575 m)  Wt 149 lb (67.586 kg)  BMI 27.25 kg/m2  SpO2 99%             General:                        well-appearing             HEENT:                       Unremarkable               Neck:                           no JVD, no bruits, no adenopathy               Chest:                         clear to auscultation, symmetrical breath sounds, no wheezes, no rhonchi               CV:                              RRR, grade II/VI systolic murmur               Abdomen:  soft, non-tender, no masses               Extremities:                 warm, well-perfused, pulses diminished, no LE edema             Rectal/GU                   Deferred             Neuro:                         Grossly non-focal and symmetrical throughout             Skin:                            Clean and dry, no rashes, no breakdown   Diagnostic Tests:  Transthoracic and transesophageal echocardiograms performed in May 2009, June 2012, and January 2013 are all reviewed. These demonstrate mitral regurgitation  which has progressed from probably moderate severity in May of 2009 to severe (4+) in both June of 2012 and January 2013. Functional anatomy of the mitral valve appears to be primarily type I dysfunction with fairly normal leaflet mobility, although there is some suggestion of functional restriction during systole of the posterior leaflet (type IIIB).  There is severe biatrial enlargement. There is now at least moderate to severe left ventricular dysfunction. There is at least moderate (3+) tricuspid regurgitation which appears to be primarily related to type I dysfunction.    Left and right heart catheterization performed 04/14/2011 is reviewed. This demonstrates normal coronary artery anatomy with no significant coronary artery disease. Right heart catheterization at that time demonstrated pulmonary artery pressure 38/13 with pulmonary capillary wedge pressure 24 a large V waves. Central venous pressure was 13 with mixed venous oxygen saturation of only 46.5%. Cardiac output ranged between 3.7 3.9 L per minute.  Repeat right heart catheterization was performed in January 2013 revealed pulmonary artery pressure 33/17 with pulmonary catheter wedge pressure mean of 15 and large V waves. Cardiac output ranged between 3.1 and 3.4 L per minute corresponding to a cardiac index between 1.9 and 2.0. Mixed venous oxygen saturation was 55%. Central venous pressure was 5.   Impression:  Patient has severe mitral regurgitation with long-standing chronic persistent atrial fibrillation and chronic systolic and diastolic congestive heart failure. Left ventricular function with moderate to severely impaired. The patient has mild pulmonary hypertension as well as moderate to severe tricuspid regurgitation. She has been remarkably stable over the past 8 weeks since her most recent hospitalization.  Plan:  The rationale for elective mitral valve repair surgery has been explained, including a comparison between surgery and  continued medical therapy with close follow-up.  The likelihood of successful and durable valve repair has been discussed with particular reference to the findings of their recent echocardiogram.  Based upon these findings and previous experience, I have quoted them a greater than 80 percent likelihood of successful valve repair.  In the unlikely event that their valve cannot be successfully repaired, we discussed the possibility of replacing the mitral valve using a mechanical prosthesis with the attendant need for long-term anticoagulation versus the alternative of replacing it using a bioprosthetic tissue valve with its potential for late structural valve deterioration and failure, depending upon the patient's longevity.  The patient specifically requests that if  the mitral valve must be replaced that it be done using a bioprosthetic tissue valve.  The need for tricuspid valve repair has also been discussed, as has the rationale for maze procedure and placement of LV epicardial permanent pacemaker leads as been reviewed.  The patient understands and accepts all potential associated risks of surgery including but not limited to risk of death, stroke, myocardial infarction, congestive heart failure, respiratory failure, renal failure, bleeding requiring blood transfusion and/or reexploration, arrhythmia, heart block or bradycardia requiring permanent pacemaker, pneumonia, pleural effusion, wound infection, pulmonary embolus or other thromboembolic complication, chronic pain or other delayed complications. All questions answered.  We tentatively plan for surgery on Thursday January 04, 2012.     Salvatore Decent. Cornelius Moras, MD

## 2012-01-01 NOTE — Progress Notes (Signed)
[address]            [place] [address]          [phone]     CARDIOTHORACIC SURGERY OFFICE NOTE  Referring Provider is Tara Ohs., MD PCP is Tara Provost, MD, MD   HPI:  Patient returns for followup with tentatively plans to proceed with surgery later this week. She reports that over the past 2 weeks she has done well and she has no new problems or complaints. She specifically denies any fevers chills productive cough change of appetite. Remainder of her review of systems is unchanged from previously.   Current Outpatient Prescriptions  Medication Sig Dispense Refill  . ALPRAZolam (XANAX) 0.25 MG tablet Take 0.25 mg by mouth 2 (two) times daily as needed. For sleep      . atorvastatin (LIPITOR) 40 MG tablet Take 40 mg by mouth at bedtime.       . dicyclomine (BENTYL) 20 MG tablet Take 20 mg by mouth 2 (two) times daily.       . digoxin (LANOXIN) 0.125 MG tablet Take 125 mcg by mouth daily.      . ferrous sulfate 325 (65 FE) MG tablet Take 325 mg by mouth daily with breakfast.      . furosemide (LASIX) 20 MG tablet Take 20 mg by mouth daily.      Marland Kitchen gemfibrozil (LOPID) 600 MG tablet Take 600 mg by mouth 2 (two) times daily before a meal.      . losartan (COZAAR) 100 MG tablet Take 50 mg by mouth daily.       . metoprolol succinate (TOPROL-XL) 100 MG 24 hr tablet Take 100 mg by mouth 2 (two) times daily. Take with or immediately following a meal.      . metoprolol succinate (TOPROL-XL) 25 MG 24 hr tablet Take 25 mg by mouth at bedtime.      . promethazine (PHENERGAN) 12.5 MG tablet Take 12.5 mg by mouth as needed. For nausea.      . saxagliptin HCl (ONGLYZA) 5 MG TABS tablet Take 5 mg by mouth daily.      . traMADol (ULTRAM) 50 MG tablet Take 50 mg by mouth 2 (two) times daily as needed. For pain      . vitamin B-12 (CYANOCOBALAMIN) 500 MCG tablet Take 500 mcg by mouth 2 (two) times daily.          Physical Exam:   BP 145/78  Pulse  64  Resp 18  Ht 5\' 2"  (1.575 m)  Wt 147 lb (66.679 kg)  BMI 26.89 kg/m2  SpO2 96%  General:  Well-appearing  Chest:   Clear to auscultation  CV:   Irregular rhythm with prominent murmur  Incisions:  n/a  Abdomen:  Soft and nontender  Extremities:  Warm and well-perfused  Diagnostic Tests:  Results for Tara Silva, Tara Silva (MRN [id number]) as of 01/01/2012 14:01  Ref. Range 01/01/2012 10:48  Sample type No range found ARTERIAL DRAW  FIO2 No range found 0.21  pH, Arterial Latest Range: 7.350-7.400  7.381  pCO2 arterial Latest Range: 35.0-45.0 mmHg 40.1  pO2, Arterial Latest Range: 80.0-100.0 mmHg 88.5  Bicarbonate Latest Range: 20.0-24.0 mEq/L 23.3  TCO2 Latest Range: 0-100 mmol/L 24.5  Acid-base deficit Latest Range: 0.0-2.0 mmol/L 1.1  O2 Saturation No range found 96.8  Patient temperature No range found 98.6  Collection  site No range found LEFT RADIAL  Allens test (pass/fail) Latest Range: PASS  PASS  Sodium Latest Range: 135-145 mEq/L 136  Potassium Latest Range: 3.5-5.1 mEq/L 4.6  Chloride Latest Range: 96-112 mEq/L 101  CO2 Latest Range: 19-32 mEq/L 21  BUN Latest Range: 6-23 mg/dL 22  Creat Latest Range: 0.50-1.10 mg/dL 2.95  Calcium Latest Range: 8.4-10.5 mg/dL 9.8  GFR calc non Af Amer Latest Range: >90 mL/min 52 (L)  GFR calc Af Amer Latest Range: >90 mL/min 61 (L)  Glucose Latest Range: 70-99 mg/dL 284 (H)  Alkaline Phosphatase Latest Range: 39-117 U/L 83  Albumin Latest Range: 3.5-5.2 g/dL 4.1  AST Latest Range: 0-37 U/L 15  ALT Latest Range: 0-35 U/L 10  Total Protein Latest Range: 6.0-8.3 g/dL 7.3  Total Bilirubin Latest Range: 0.3-1.2 mg/dL 0.3  WBC Latest Range: 4.0-10.5 K/uL 10.8 (H)  RBC Latest Range: 3.87-5.11 MIL/uL 3.87  HGB Latest Range: 12.0-15.0 g/dL 13.2  HCT Latest Range: 36.0-46.0 % 37.0  MCV Latest Range: 78.0-100.0 fL 95.6  MCH Latest Range: 26.0-34.0 pg 31.5  MCHC Latest Range: 30.0-36.0 g/dL 44.0  RDW Latest Range: 11.5-15.5 % 13.0  Platelets  Latest Range: 150-400 K/uL 221  Prothrombin Time Latest Range: 11.6-15.2 seconds 14.7  INR Latest Range: 0.00-1.49  1.13  aPTT Latest Range: 24-37 seconds 36     Impression:  Severe mitral regurgitation with long-standing chronic persistent atrial fibrillation and chronic systolic and diastolic congestive heart failure as well as moderate to severe tricuspid regurgitation and underlying left bundle branch block.  Plan:  We plan to proceed with mitral valve repair or replacement, tricuspid valve repair, and Maze procedure, and placement of left ventricular epicardial pacemaker leads later this week. I have again reviewed the indications, risks, and potential benefits of surgery with the patient and her daughter. All of her questions been addressed.   Salvatore Decent. Cornelius Moras, MD 01/01/2012 2:00 PM

## 2012-01-01 NOTE — Progress Notes (Signed)
Pre-op Cardiac Surgery  Carotid Findings:  No ICA stenosis.  Vertebral artery flow is antegrade.  Upper Extremity Right Left  Brachial Pressures 139T 140T  Radial Waveforms T T  Ulnar Waveforms T T  Palmar Arch (Allen's Test) WNL Doppler signal obliterates with both radial and ulnar compression.   Findings:      Lower  Extremity Right Left  Dorsalis Pedis    Anterior Tibial    Posterior Tibial    Ankle/Brachial Indices      Findings:

## 2012-01-02 LAB — HEMOGLOBIN A1C: Mean Plasma Glucose: 146 mg/dL — ABNORMAL HIGH (ref <117)

## 2012-01-03 MED ORDER — POTASSIUM CHLORIDE 2 MEQ/ML IV SOLN
80.0000 meq | INTRAVENOUS | Status: DC
  Filled 2012-01-03: qty 40

## 2012-01-03 MED ORDER — DEXTROSE 5 % IV SOLN
1.5000 g | INTRAVENOUS | Status: AC
  Administered 2012-01-04: 1.5 g via INTRAVENOUS
  Administered 2012-01-04: 750 g via INTRAVENOUS
  Filled 2012-01-03: qty 1.5

## 2012-01-03 MED ORDER — METOPROLOL TARTRATE 12.5 MG HALF TABLET: 12.5000 mg | ORAL_TABLET | Freq: Once | ORAL | Status: DC

## 2012-01-03 MED ORDER — MAGNESIUM SULFATE 50 % IJ SOLN
40.0000 meq | INTRAMUSCULAR | Status: DC
  Filled 2012-01-03: qty 10

## 2012-01-03 MED ORDER — EPINEPHRINE HCL 1 MG/ML IJ SOLN
0.5000 ug/min | INTRAVENOUS | Status: DC
  Filled 2012-01-03: qty 4

## 2012-01-03 MED ORDER — DEXTROSE 5 % IV SOLN
750.0000 mg | INTRAVENOUS | Status: DC
  Filled 2012-01-03: qty 750

## 2012-01-03 MED ORDER — SODIUM CHLORIDE 0.9 % IV SOLN
0.1000 ug/kg/h | INTRAVENOUS | Status: AC
  Administered 2012-01-04: .3 ug/kg/h via INTRAVENOUS
  Filled 2012-01-03: qty 4

## 2012-01-03 MED ORDER — SODIUM CHLORIDE 0.9 % IV SOLN
INTRAVENOUS | Status: AC
  Administered 2012-01-04: 70 mL/h via INTRAVENOUS
  Filled 2012-01-03: qty 40

## 2012-01-03 MED ORDER — VANCOMYCIN HCL 1000 MG IV SOLR
1250.0000 mg | INTRAVENOUS | Status: AC
  Administered 2012-01-04: 1250 mg via INTRAVENOUS
  Filled 2012-01-03: qty 1250

## 2012-01-03 MED ORDER — SODIUM CHLORIDE 0.9 % IV SOLN
INTRAVENOUS | Status: AC
  Administered 2012-01-04: 4.5 [IU]/h via INTRAVENOUS
  Filled 2012-01-03: qty 1

## 2012-01-03 MED ORDER — VERAPAMIL HCL 2.5 MG/ML IV SOLN
INTRAVENOUS | Status: DC
  Filled 2012-01-03: qty 2.5

## 2012-01-03 MED ORDER — NITROGLYCERIN IN D5W 200-5 MCG/ML-% IV SOLN
2.0000 ug/min | INTRAVENOUS | Status: AC
Start: 2012-01-04 — End: 2012-01-04
  Administered 2012-01-04: 16 ug/min via INTRAVENOUS
  Filled 2012-01-03 (×2): qty 250

## 2012-01-03 MED ORDER — PHENYLEPHRINE HCL 10 MG/ML IJ SOLN
30.0000 ug/min | INTRAMUSCULAR | Status: DC
  Filled 2012-01-03: qty 2

## 2012-01-03 MED ORDER — DOPAMINE-DEXTROSE 3.2-5 MG/ML-% IV SOLN
2.0000 ug/kg/min | INTRAVENOUS | Status: DC
  Filled 2012-01-03: qty 250

## 2012-01-03 NOTE — Progress Notes (Signed)
CALL TO [facility]... NO PFTS RECEIVED AT THIS TIME ... SHE STATES SHE WILL " REFAX"  THEM TO  [phone] AND  [phone]... CHECKED FOR THEM  THIS AM  AT 1000 IN [facility] OFFICE BLUE FOLDER .. .Marland KitchenMarland KitchenAND AGAIN AT THIS TIME ALSO ... ERIKA AND KRISTI MOORE ADVISED  THAT THEY ARE NOT  HERE.

## 2012-01-04 ENCOUNTER — Encounter (HOSPITAL_COMMUNITY): Payer: Self-pay | Admitting: Thoracic Surgery (Cardiothoracic Vascular Surgery)

## 2012-01-04 ENCOUNTER — Ambulatory Visit (HOSPITAL_COMMUNITY): Payer: Medicare Other | Admitting: Vascular Surgery

## 2012-01-04 ENCOUNTER — Inpatient Hospital Stay (HOSPITAL_COMMUNITY): Payer: Medicare Other

## 2012-01-04 ENCOUNTER — Encounter (HOSPITAL_COMMUNITY)
Admission: RE | Disposition: A | Discharge status: Home Health | Payer: Self-pay | Source: Ambulatory Visit | Attending: Thoracic Surgery (Cardiothoracic Vascular Surgery)

## 2012-01-04 ENCOUNTER — Encounter (HOSPITAL_COMMUNITY): Payer: Self-pay | Admitting: Vascular Surgery

## 2012-01-04 ENCOUNTER — Inpatient Hospital Stay (HOSPITAL_COMMUNITY)
Admission: RE | Admit: 2012-01-04 | Discharge: 2012-01-12 | DRG: 220 | Disposition: A | Discharge status: Home Health | Payer: Medicare Other | Source: Ambulatory Visit | Attending: Thoracic Surgery (Cardiothoracic Vascular Surgery) | Admitting: Thoracic Surgery (Cardiothoracic Vascular Surgery)

## 2012-01-04 ENCOUNTER — Other Ambulatory Visit: Payer: Self-pay

## 2012-01-04 DIAGNOSIS — I447 Left bundle-branch block, unspecified: Secondary | ICD-10-CM | POA: Diagnosis present

## 2012-01-04 DIAGNOSIS — I059 Rheumatic mitral valve disease, unspecified: Principal | ICD-10-CM | POA: Diagnosis present

## 2012-01-04 DIAGNOSIS — I079 Rheumatic tricuspid valve disease, unspecified: Secondary | ICD-10-CM

## 2012-01-04 DIAGNOSIS — Z9981 Dependence on supplemental oxygen: Secondary | ICD-10-CM

## 2012-01-04 DIAGNOSIS — Z96659 Presence of unspecified artificial knee joint: Secondary | ICD-10-CM

## 2012-01-04 DIAGNOSIS — I4891 Unspecified atrial fibrillation: Secondary | ICD-10-CM | POA: Diagnosis present

## 2012-01-04 DIAGNOSIS — Z9889 Other specified postprocedural states: Secondary | ICD-10-CM

## 2012-01-04 DIAGNOSIS — Z8679 Personal history of other diseases of the circulatory system: Secondary | ICD-10-CM

## 2012-01-04 DIAGNOSIS — I509 Heart failure, unspecified: Secondary | ICD-10-CM | POA: Diagnosis present

## 2012-01-04 DIAGNOSIS — I2789 Other specified pulmonary heart diseases: Secondary | ICD-10-CM | POA: Diagnosis present

## 2012-01-04 DIAGNOSIS — I5042 Chronic combined systolic (congestive) and diastolic (congestive) heart failure: Secondary | ICD-10-CM | POA: Diagnosis present

## 2012-01-04 DIAGNOSIS — D72829 Elevated white blood cell count, unspecified: Secondary | ICD-10-CM | POA: Diagnosis not present

## 2012-01-04 DIAGNOSIS — Z87891 Personal history of nicotine dependence: Secondary | ICD-10-CM

## 2012-01-04 DIAGNOSIS — N183 Chronic kidney disease, stage 3 unspecified: Secondary | ICD-10-CM | POA: Diagnosis present

## 2012-01-04 DIAGNOSIS — I43 Cardiomyopathy in diseases classified elsewhere: Secondary | ICD-10-CM | POA: Diagnosis present

## 2012-01-04 DIAGNOSIS — Z90710 Acquired absence of both cervix and uterus: Secondary | ICD-10-CM

## 2012-01-04 DIAGNOSIS — I110 Hypertensive heart disease with heart failure: Secondary | ICD-10-CM | POA: Diagnosis present

## 2012-01-04 DIAGNOSIS — I442 Atrioventricular block, complete: Secondary | ICD-10-CM

## 2012-01-04 DIAGNOSIS — Z7901 Long term (current) use of anticoagulants: Secondary | ICD-10-CM

## 2012-01-04 DIAGNOSIS — I428 Other cardiomyopathies: Secondary | ICD-10-CM

## 2012-01-04 DIAGNOSIS — E119 Type 2 diabetes mellitus without complications: Secondary | ICD-10-CM | POA: Diagnosis present

## 2012-01-04 DIAGNOSIS — D62 Acute posthemorrhagic anemia: Secondary | ICD-10-CM | POA: Diagnosis not present

## 2012-01-04 DIAGNOSIS — I498 Other specified cardiac arrhythmias: Secondary | ICD-10-CM | POA: Diagnosis not present

## 2012-01-04 HISTORY — DX: Other specified postprocedural states: Z98.890

## 2012-01-04 HISTORY — PX: TRICUSPID VALVE REPLACEMENT: SHX816

## 2012-01-04 HISTORY — DX: Personal history of other diseases of the circulatory system: Z86.79

## 2012-01-04 HISTORY — PX: MAZE: SHX5063

## 2012-01-04 HISTORY — PX: MITRAL VALVE REPAIR: SHX2039

## 2012-01-04 LAB — POCT I-STAT 3, ART BLOOD GAS (G3+)
Acid-base deficit: 2 mmol/L (ref 0.0–2.0)
Bicarbonate: 24.6 mEq/L — ABNORMAL HIGH (ref 20.0–24.0)
Bicarbonate: 23 mEq/L (ref 20.0–24.0)
Bicarbonate: 25.7 mEq/L — ABNORMAL HIGH (ref 20.0–24.0)
O2 Saturation: 100 %
O2 Saturation: 100 %
O2 Saturation: 96 %
Patient temperature: 36.6
TCO2: 26 mmol/L (ref 0–100)
TCO2: 27 mmol/L (ref 0–100)
pCO2 arterial: 35.4 mmHg (ref 35.0–45.0)
pCO2 arterial: 36.9 mmHg (ref 35.0–45.0)
pH, Arterial: 7.476 — ABNORMAL HIGH (ref 7.350–7.400)
pH, Arterial: 7.398 (ref 7.350–7.400)
pO2, Arterial: 76 mmHg — ABNORMAL LOW (ref 80.0–100.0)

## 2012-01-04 LAB — POCT I-STAT 4, (NA,K, GLUC, HGB,HCT)
Glucose, Bld: 210 mg/dL — ABNORMAL HIGH (ref 70–99)
Glucose, Bld: 135 mg/dL — ABNORMAL HIGH (ref 70–99)
Glucose, Bld: 82 mg/dL (ref 70–99)
HCT: 30 % — ABNORMAL LOW (ref 36.0–46.0)
HCT: 23 % — ABNORMAL LOW (ref 36.0–46.0)
HCT: 27 % — ABNORMAL LOW (ref 36.0–46.0)
Hemoglobin: 10.2 g/dL — ABNORMAL LOW (ref 12.0–15.0)
Hemoglobin: 7.1 g/dL — ABNORMAL LOW (ref 12.0–15.0)
Hemoglobin: 7.8 g/dL — ABNORMAL LOW (ref 12.0–15.0)
Hemoglobin: 8.8 g/dL — ABNORMAL LOW (ref 12.0–15.0)
Potassium: 3.5 mEq/L (ref 3.5–5.1)
Potassium: 3.8 mEq/L (ref 3.5–5.1)
Potassium: 3.7 mEq/L (ref 3.5–5.1)
Sodium: 135 mEq/L (ref 135–145)
Sodium: 140 mEq/L (ref 135–145)
Sodium: 141 mEq/L (ref 135–145)

## 2012-01-04 LAB — CBC
HCT: 26.7 % — ABNORMAL LOW (ref 36.0–46.0)
Hemoglobin: 8.9 g/dL — ABNORMAL LOW (ref 12.0–15.0)
MCH: 31.6 pg (ref 26.0–34.0)
MCH: 31.7 pg (ref 26.0–34.0)
MCHC: 33.3 g/dL (ref 30.0–36.0)
MCV: 94.7 fL (ref 78.0–100.0)
MCV: 93.7 fL (ref 78.0–100.0)
Platelets: 148 10*3/uL — ABNORMAL LOW (ref 150–400)
RBC: 3.15 MIL/uL — ABNORMAL LOW (ref 3.87–5.11)
RDW: 13 % (ref 11.5–15.5)
RDW: 13.1 % (ref 11.5–15.5)
WBC: 25 10*3/uL — ABNORMAL HIGH (ref 4.0–10.5)

## 2012-01-04 LAB — CREATININE, SERUM
GFR calc Af Amer: 77 mL/min — ABNORMAL LOW (ref >90)
GFR calc non Af Amer: 66 mL/min — ABNORMAL LOW (ref >90)

## 2012-01-04 LAB — POCT I-STAT, CHEM 8
Calcium, Ion: 1.16 mmol/L (ref 1.12–1.32)
Creatinine, Ser: 0.9 mg/dL (ref 0.50–1.10)
Glucose, Bld: 147 mg/dL — ABNORMAL HIGH (ref 70–99)
HCT: 30 % — ABNORMAL LOW (ref 36.0–46.0)
Hemoglobin: 10.2 g/dL — ABNORMAL LOW (ref 12.0–15.0)
TCO2: 24 mmol/L (ref 0–100)

## 2012-01-04 LAB — GLUCOSE, CAPILLARY: Glucose-Capillary: 180 mg/dL — ABNORMAL HIGH (ref 70–99)

## 2012-01-04 LAB — MAGNESIUM: Magnesium: 3 mg/dL — ABNORMAL HIGH (ref 1.5–2.5)

## 2012-01-04 SURGERY — REPAIR, MITRAL VALVE
Anesthesia: General | Site: Chest | Wound class: Clean

## 2012-01-04 MED ORDER — DOPAMINE-DEXTROSE 3.2-5 MG/ML-% IV SOLN
INTRAVENOUS | Status: DC | PRN
  Administered 2012-01-04: 3 ug/kg/min via INTRAVENOUS

## 2012-01-04 MED ORDER — BISACODYL 10 MG RE SUPP: 10.0000 mg | Freq: Every day | RECTAL | Status: DC

## 2012-01-04 MED ORDER — VECURONIUM BROMIDE 10 MG IV SOLR
INTRAVENOUS | Status: DC | PRN
  Administered 2012-01-04 (×4): 5 mg via INTRAVENOUS

## 2012-01-04 MED ORDER — MAGNESIUM SULFATE 40 MG/ML IJ SOLN
INTRAMUSCULAR | Status: AC
  Administered 2012-01-04: 4 g
  Filled 2012-01-04: qty 100

## 2012-01-04 MED ORDER — ALBUMIN HUMAN 5 % IV SOLN
INTRAVENOUS | Status: DC | PRN
  Administered 2012-01-04: 13:00:00 via INTRAVENOUS

## 2012-01-04 MED ORDER — MUPIROCIN 2 % EX OINT
1.0000 "application " | TOPICAL_OINTMENT | Freq: Two times a day (BID) | CUTANEOUS | Status: AC
  Administered 2012-01-04 – 2012-01-06 (×4): 1 via NASAL
  Filled 2012-01-04: qty 22

## 2012-01-04 MED ORDER — MAGNESIUM SULFATE 40 MG/ML IJ SOLN
4.0000 g | Freq: Once | INTRAMUSCULAR | Status: AC
  Administered 2012-01-04: 4 g via INTRAVENOUS

## 2012-01-04 MED ORDER — LACTATED RINGERS IV SOLN
INTRAVENOUS | Status: DC
  Administered 2012-01-04: 20 mL via INTRAVENOUS
  Administered 2012-01-04: 15:00:00 via INTRAVENOUS

## 2012-01-04 MED ORDER — MIDAZOLAM HCL 2 MG/2ML IJ SOLN: 2.0000 mg | INTRAMUSCULAR | Status: DC | PRN

## 2012-01-04 MED ORDER — LACTATED RINGERS IV SOLN
INTRAVENOUS | Status: DC | PRN
  Administered 2012-01-04: 07:00:00 via INTRAVENOUS

## 2012-01-04 MED ORDER — LIDOCAINE HCL (CARDIAC) 20 MG/ML IV SOLN
INTRAVENOUS | Status: DC | PRN
  Administered 2012-01-04: 40 mg via INTRAVENOUS

## 2012-01-04 MED ORDER — FAMOTIDINE IN NACL 20-0.9 MG/50ML-% IV SOLN
20.0000 mg | Freq: Two times a day (BID) | INTRAVENOUS | Status: DC
  Administered 2012-01-04: 20 mg via INTRAVENOUS

## 2012-01-04 MED ORDER — NITROGLYCERIN IN D5W 200-5 MCG/ML-% IV SOLN
0.0000 ug/min | INTRAVENOUS | Status: DC
  Administered 2012-01-04: 0 ug/min via INTRAVENOUS

## 2012-01-04 MED ORDER — PROPOFOL 10 MG/ML IV EMUL
INTRAVENOUS | Status: DC | PRN
  Administered 2012-01-04: 20 mg via INTRAVENOUS

## 2012-01-04 MED ORDER — MORPHINE SULFATE 2 MG/ML IJ SOLN: 1.0000 mg | INTRAMUSCULAR | Status: AC | PRN

## 2012-01-04 MED ORDER — SODIUM CHLORIDE 0.9 % IV SOLN
0.4000 ug/kg/h | INTRAVENOUS | Status: DC
  Administered 2012-01-04: 0.4 ug/kg/h via INTRAVENOUS
  Filled 2012-01-04: qty 4

## 2012-01-04 MED ORDER — SODIUM CHLORIDE 0.9 % IV SOLN: 0.1000 ug/kg/h | INTRAVENOUS | Status: DC

## 2012-01-04 MED ORDER — ALBUMIN HUMAN 5 % IV SOLN
250.0000 mL | INTRAVENOUS | Status: AC | PRN
  Administered 2012-01-04: 250 mL via INTRAVENOUS

## 2012-01-04 MED ORDER — ASPIRIN EC 325 MG PO TBEC: 325.0000 mg | DELAYED_RELEASE_TABLET | Freq: Every day | ORAL | Status: DC

## 2012-01-04 MED ORDER — HEMOSTATIC AGENTS (NO CHARGE) OPTIME
TOPICAL | Status: DC | PRN
  Administered 2012-01-04: 3 via TOPICAL

## 2012-01-04 MED ORDER — INSULIN REGULAR BOLUS VIA INFUSION
0.0000 [IU] | Freq: Three times a day (TID) | INTRAVENOUS | Status: DC
  Filled 2012-01-04: qty 10

## 2012-01-04 MED ORDER — SODIUM CHLORIDE 0.9 % IV SOLN
INTRAVENOUS | Status: DC
  Administered 2012-01-04: 15:00:00 via INTRAVENOUS

## 2012-01-04 MED ORDER — MIDAZOLAM HCL 5 MG/5ML IJ SOLN
INTRAMUSCULAR | Status: DC | PRN
  Administered 2012-01-04: 2 mg via INTRAVENOUS
  Administered 2012-01-04 (×3): 3 mg via INTRAVENOUS
  Administered 2012-01-04: 5 mg via INTRAVENOUS
  Administered 2012-01-04: 4 mg via INTRAVENOUS

## 2012-01-04 MED ORDER — SODIUM CHLORIDE 0.9 % IV SOLN: 250.0000 mL | INTRAVENOUS | Status: DC

## 2012-01-04 MED ORDER — METOPROLOL TARTRATE 12.5 MG HALF TABLET
12.5000 mg | ORAL_TABLET | Freq: Two times a day (BID) | ORAL | Status: DC
  Filled 2012-01-04: qty 1

## 2012-01-04 MED ORDER — ROCURONIUM BROMIDE 100 MG/10ML IV SOLN
INTRAVENOUS | Status: DC | PRN
  Administered 2012-01-04: 50 mg via INTRAVENOUS

## 2012-01-04 MED ORDER — ACETAMINOPHEN 500 MG PO TABS
1000.0000 mg | ORAL_TABLET | Freq: Four times a day (QID) | ORAL | Status: AC
  Administered 2012-01-05 – 2012-01-09 (×16): 1000 mg via ORAL
  Filled 2012-01-04 (×21): qty 2

## 2012-01-04 MED ORDER — VANCOMYCIN HCL 1000 MG IV SOLR
1000.0000 mg | Freq: Once | INTRAVENOUS | Status: AC
  Administered 2012-01-04: 1000 mg via INTRAVENOUS
  Filled 2012-01-04 (×2): qty 1000

## 2012-01-04 MED ORDER — HEPARIN SODIUM (PORCINE) 1000 UNIT/ML IJ SOLN
INTRAMUSCULAR | Status: DC | PRN
  Administered 2012-01-04: 19000 [IU] via INTRAVENOUS

## 2012-01-04 MED ORDER — METOPROLOL TARTRATE 25 MG/10 ML ORAL SUSPENSION
12.5000 mg | Freq: Two times a day (BID) | ORAL | Status: DC
  Filled 2012-01-04: qty 5

## 2012-01-04 MED ORDER — CHLORHEXIDINE GLUCONATE 4 % EX LIQD: 30.0000 mL | CUTANEOUS | Status: DC

## 2012-01-04 MED ORDER — DEXTROSE 5 % IV SOLN
1.5000 g | Freq: Two times a day (BID) | INTRAVENOUS | Status: AC
  Administered 2012-01-04 – 2012-01-06 (×4): 1.5 g via INTRAVENOUS
  Filled 2012-01-04 (×4): qty 1.5

## 2012-01-04 MED ORDER — MORPHINE SULFATE 4 MG/ML IJ SOLN
2.0000 mg | INTRAMUSCULAR | Status: DC | PRN
  Administered 2012-01-04 – 2012-01-05 (×2): 2 mg via INTRAVENOUS
  Administered 2012-01-05 (×2): 4 mg via INTRAVENOUS
  Filled 2012-01-04 (×3): qty 1

## 2012-01-04 MED ORDER — ONDANSETRON HCL 4 MG/2ML IJ SOLN: 4.0000 mg | Freq: Four times a day (QID) | INTRAMUSCULAR | Status: DC | PRN

## 2012-01-04 MED ORDER — METOPROLOL TARTRATE 1 MG/ML IV SOLN: 2.5000 mg | INTRAVENOUS | Status: DC | PRN

## 2012-01-04 MED ORDER — DOPAMINE-DEXTROSE 3.2-5 MG/ML-% IV SOLN
3.0000 ug/kg/min | INTRAVENOUS | Status: DC
  Administered 2012-01-04: 3 ug/kg/min via INTRAVENOUS

## 2012-01-04 MED ORDER — BISACODYL 5 MG PO TBEC
10.0000 mg | DELAYED_RELEASE_TABLET | Freq: Every day | ORAL | Status: DC
  Administered 2012-01-06 – 2012-01-09 (×3): 10 mg via ORAL
  Filled 2012-01-04 (×3): qty 2

## 2012-01-04 MED ORDER — PANTOPRAZOLE SODIUM 40 MG PO TBEC
40.0000 mg | DELAYED_RELEASE_TABLET | Freq: Every day | ORAL | Status: DC
  Administered 2012-01-06 – 2012-01-12 (×7): 40 mg via ORAL
  Filled 2012-01-04 (×7): qty 1

## 2012-01-04 MED ORDER — POTASSIUM CHLORIDE 10 MEQ/50ML IV SOLN
10.0000 meq | INTRAVENOUS | Status: AC
  Administered 2012-01-04 (×3): 10 meq via INTRAVENOUS

## 2012-01-04 MED ORDER — ACETAMINOPHEN 160 MG/5ML PO SOLN: 650.0000 mg | ORAL | Status: AC

## 2012-01-04 MED ORDER — ACETAMINOPHEN 650 MG RE SUPP
650.0000 mg | RECTAL | Status: AC
  Administered 2012-01-04: 650 mg via RECTAL

## 2012-01-04 MED ORDER — ASPIRIN 81 MG PO CHEW: 324.0000 mg | CHEWABLE_TABLET | Freq: Every day | ORAL | Status: DC

## 2012-01-04 MED ORDER — SODIUM CHLORIDE 0.45 % IV SOLN
INTRAVENOUS | Status: DC
  Administered 2012-01-04: 20 mL via INTRAVENOUS
  Administered 2012-01-08: 50 mL/h via INTRAVENOUS

## 2012-01-04 MED ORDER — CALCIUM CHLORIDE 10 % IV SOLN
1.0000 g | Freq: Once | INTRAVENOUS | Status: AC | PRN
  Filled 2012-01-04: qty 10

## 2012-01-04 MED ORDER — SODIUM CHLORIDE 0.9 % IV SOLN
INTRAVENOUS | Status: DC | PRN
  Administered 2012-01-04: 08:00:00 via INTRAVENOUS

## 2012-01-04 MED ORDER — FENTANYL CITRATE 0.05 MG/ML IJ SOLN
INTRAMUSCULAR | Status: DC | PRN
  Administered 2012-01-04 (×5): 250 ug via INTRAVENOUS
  Administered 2012-01-04: 100 ug via INTRAVENOUS
  Administered 2012-01-04: 650 ug via INTRAVENOUS
  Administered 2012-01-04: 100 ug via INTRAVENOUS
  Administered 2012-01-04: 150 ug via INTRAVENOUS

## 2012-01-04 MED ORDER — ACETAMINOPHEN 160 MG/5ML PO SOLN: 975.0000 mg | Freq: Four times a day (QID) | ORAL | Status: AC

## 2012-01-04 MED ORDER — PROTAMINE SULFATE 10 MG/ML IV SOLN
INTRAVENOUS | Status: DC | PRN
  Administered 2012-01-04: 20 mg via INTRAVENOUS

## 2012-01-04 MED ORDER — SODIUM CHLORIDE 0.9 % IJ SOLN
3.0000 mL | Freq: Two times a day (BID) | INTRAMUSCULAR | Status: DC
  Administered 2012-01-05: 6 mL via INTRAVENOUS
  Administered 2012-01-05 – 2012-01-08 (×6): 3 mL via INTRAVENOUS

## 2012-01-04 MED ORDER — DEXTROSE 5 % IV SOLN
0.0000 ug/min | INTRAVENOUS | Status: DC
  Administered 2012-01-04: 0 ug/min via INTRAVENOUS

## 2012-01-04 MED ORDER — SODIUM CHLORIDE 0.9 % IV SOLN
INTRAVENOUS | Status: DC | PRN
  Administered 2012-01-04: 13:00:00 via INTRAVENOUS

## 2012-01-04 MED ORDER — DEXTROSE 5 % IV SOLN
INTRAVENOUS | Status: DC | PRN
  Administered 2012-01-04: 08:00:00 via INTRAVENOUS

## 2012-01-04 MED ORDER — CHLORHEXIDINE GLUCONATE CLOTH 2 % EX PADS
6.0000 | MEDICATED_PAD | Freq: Every day | CUTANEOUS | Status: AC
  Administered 2012-01-04 – 2012-01-08 (×5): 6 via TOPICAL

## 2012-01-04 MED ORDER — EPHEDRINE SULFATE 50 MG/ML IJ SOLN
INTRAMUSCULAR | Status: DC | PRN
  Administered 2012-01-04: 10 mg via INTRAVENOUS

## 2012-01-04 MED ORDER — SODIUM CHLORIDE 0.9 % IV SOLN
INTRAVENOUS | Status: DC
  Administered 2012-01-04: 1.3 [IU]/h via INTRAVENOUS

## 2012-01-04 MED ORDER — SODIUM CHLORIDE 0.9 % IR SOLN
Status: DC | PRN
  Administered 2012-01-04: 6000 mL

## 2012-01-04 MED ORDER — OXYCODONE HCL 5 MG PO TABS: 5.0000 mg | ORAL_TABLET | ORAL | Status: DC | PRN

## 2012-01-04 MED ORDER — DOCUSATE SODIUM 100 MG PO CAPS
200.0000 mg | ORAL_CAPSULE | Freq: Every day | ORAL | Status: DC
  Administered 2012-01-06 – 2012-01-12 (×5): 200 mg via ORAL
  Filled 2012-01-04 (×6): qty 2

## 2012-01-04 MED ORDER — SODIUM CHLORIDE 0.9 % IJ SOLN: 3.0000 mL | INTRAMUSCULAR | Status: DC | PRN

## 2012-01-04 MED FILL — Magnesium Sulfate Inj 50%: INTRAMUSCULAR | Qty: 10 | Status: AC

## 2012-01-04 MED FILL — Potassium Chloride Inj 2 mEq/ML: INTRAVENOUS | Qty: 40 | Status: AC

## 2012-01-04 SURGICAL SUPPLY — 109 items
ADAPTER CARDIO PERF ANTE/RETRO (ADAPTER) ×4 IMPLANT
APPLICATOR COTTON TIP 6IN STRL (MISCELLANEOUS) IMPLANT
ATTRACTOMAT 16X20 MAGNETIC DRP (DRAPES) ×4 IMPLANT
BAG DECANTER FOR FLEXI CONT (MISCELLANEOUS) ×4 IMPLANT
BLADE STERNUM SYSTEM 6 (BLADE) ×4 IMPLANT
BLADE SURG 11 STRL SS (BLADE) ×6 IMPLANT
CANISTER SUCTION 2500CC (MISCELLANEOUS) ×4 IMPLANT
CANN PRFSN 3/8X14X24FR PCFC (MISCELLANEOUS)
CANN PRFSN 3/8XCNCT ST RT ANG (MISCELLANEOUS)
CANNULA FEM VENOUS REMOTE 22FR (CANNULA) ×2 IMPLANT
CANNULA GUNDRY RCSP 15FR (MISCELLANEOUS) IMPLANT
CANNULA PRFSN 3/8X14X24FR PCFC (MISCELLANEOUS) IMPLANT
CANNULA PRFSN 3/8XCNCT RT ANG (MISCELLANEOUS) IMPLANT
CANNULA VEN MTL TIP RT (MISCELLANEOUS)
CANNULA VENNOUS METAL TIP 20FR (CANNULA) ×2 IMPLANT
CARDIOBLATE CARDIAC ABLATION (MISCELLANEOUS)
CATH FOLEY 2WAY SLVR  5CC 14FR (CATHETERS)
CATH FOLEY 2WAY SLVR 5CC 14FR (CATHETERS) IMPLANT
CATH MALECOT BARD  28FR (CATHETERS) ×4 IMPLANT
CATH ROBINSON RED A/P 18FR (CATHETERS) ×4 IMPLANT
CATH THORACIC 28FR RT ANG (CATHETERS) IMPLANT
CATH THORACIC 36FR (CATHETERS) ×2 IMPLANT
CLAMP ISOLATOR SHORT (CLAMP) ×2 IMPLANT
CLIP FOGARTY SPRING 6M (CLIP) IMPLANT
CLOTH BEACON ORANGE TIMEOUT ST (SAFETY) ×4 IMPLANT
CONN 1/2X1/2X1/2  BEN (MISCELLANEOUS) ×2
CONN 1/2X1/2X1/2 BEN (MISCELLANEOUS) ×2 IMPLANT
CONN 3/8X1/2 ST GISH (MISCELLANEOUS) ×8 IMPLANT
CONN ST 1/4X3/8  BEN (MISCELLANEOUS) ×2
CONN ST 1/4X3/8 BEN (MISCELLANEOUS) ×2 IMPLANT
CONT SPEC 4OZ CLIKSEAL STRL BL (MISCELLANEOUS) ×2 IMPLANT
COVER SURGICAL LIGHT HANDLE (MISCELLANEOUS) ×8 IMPLANT
CRADLE DONUT ADULT HEAD (MISCELLANEOUS) ×4 IMPLANT
DERMABOND ADVANCED (GAUZE/BANDAGES/DRESSINGS) ×1
DERMABOND ADVANCED .7 DNX12 (GAUZE/BANDAGES/DRESSINGS) ×1 IMPLANT
DEVICE CARDIOBLATE CARDIAC ABL (MISCELLANEOUS) IMPLANT
DRAIN CHANNEL 32F RND 10.7 FF (WOUND CARE) IMPLANT
DRAPE CARDIOVASCULAR INCISE (DRAPES) ×1
DRAPE INCISE IOBAN 66X45 STRL (DRAPES) ×2 IMPLANT
DRAPE SLUSH/WARMER DISC (DRAPES) IMPLANT
DRAPE SRG 135X102X78XABS (DRAPES) ×1 IMPLANT
DRSG COVADERM 4X14 (GAUZE/BANDAGES/DRESSINGS) ×2 IMPLANT
ELECT REM PT RETURN 9FT ADLT (ELECTROSURGICAL) ×8
ELECTRODE REM PT RTRN 9FT ADLT (ELECTROSURGICAL) ×4 IMPLANT
GLOVE BIO SURGEON STRL SZ 6 (GLOVE) ×6 IMPLANT
GLOVE BIO SURGEON STRL SZ 6.5 (GLOVE) ×10 IMPLANT
GLOVE BIO SURGEON STRL SZ7 (GLOVE) IMPLANT
GLOVE BIO SURGEON STRL SZ7.5 (GLOVE) IMPLANT
GLOVE ORTHO TXT STRL SZ7.5 (GLOVE) ×4 IMPLANT
GOWN STRL NON-REIN LRG LVL3 (GOWN DISPOSABLE) ×18 IMPLANT
HEMOSTAT POWDER SURGIFOAM 1G (HEMOSTASIS) ×8 IMPLANT
INSERT FOGARTY XLG (MISCELLANEOUS) ×2 IMPLANT
KIT BASIN OR (CUSTOM PROCEDURE TRAY) ×4 IMPLANT
KIT DILATOR VASC 18G NDL (KITS) ×2 IMPLANT
KIT DRAINAGE VACCUM ASSIST (KITS) ×2 IMPLANT
KIT PAIN CUSTOM (MISCELLANEOUS) IMPLANT
KIT ROOM TURNOVER OR (KITS) ×4 IMPLANT
KIT SUCTION CATH 14FR (SUCTIONS) ×4 IMPLANT
LEAD PACING EPI (Prosthesis & Implant Heart) ×4 IMPLANT
LINE VENT (MISCELLANEOUS) ×2 IMPLANT
MARKER GRAFT CORONARY BYPASS (MISCELLANEOUS) IMPLANT
NS IRRIG 1000ML POUR BTL (IV SOLUTION) ×16 IMPLANT
PACK OPEN HEART (CUSTOM PROCEDURE TRAY) ×4 IMPLANT
PAD ARMBOARD 7.5X6 YLW CONV (MISCELLANEOUS) ×8 IMPLANT
PROBE CRYO2-ABLATION MALLABLE (MISCELLANEOUS) ×2 IMPLANT
RING MITRAL MEMO 3D 26MM SMD26 (Prosthesis & Implant Heart) ×2 IMPLANT
RING TRICUSPID T26 (Prosthesis & Implant Heart) ×2 IMPLANT
SET CARDIOPLEGIA MPS 5001102 (MISCELLANEOUS) ×2 IMPLANT
SET IRRIG TUBING LAPAROSCOPIC (IRRIGATION / IRRIGATOR) ×2 IMPLANT
SPONGE GAUZE 4X4 12PLY (GAUZE/BANDAGES/DRESSINGS) ×2 IMPLANT
SPONGE LAP 18X18 X RAY DECT (DISPOSABLE) ×2 IMPLANT
SUCKER INTRACARDIAC WEIGHTED (SUCKER) ×4 IMPLANT
SUT BONE WAX W31G (SUTURE) ×2 IMPLANT
SUT ETHIBOND (SUTURE) ×8 IMPLANT
SUT ETHIBOND 2 0 SH (SUTURE) ×6 IMPLANT
SUT ETHIBOND 2 0 SH 36X2 (SUTURE) ×4 IMPLANT
SUT ETHIBOND 2 0 V4 (SUTURE) IMPLANT
SUT ETHIBOND 2 0V4 GREEN (SUTURE) IMPLANT
SUT ETHIBOND 2-0 RB-1 WHT (SUTURE) ×8 IMPLANT
SUT ETHIBOND 4 0 TF (SUTURE) IMPLANT
SUT ETHIBOND 5 0 C 1 30 (SUTURE) ×4 IMPLANT
SUT ETHIBOND NAB MH 2-0 36IN (SUTURE) ×2 IMPLANT
SUT ETHIBOND X763 2 0 SH 1 (SUTURE) ×2 IMPLANT
SUT MNCRL AB 3-0 PS2 18 (SUTURE) IMPLANT
SUT MNCRL AB 4-0 PS2 18 (SUTURE) ×2 IMPLANT
SUT PDS AB 1 CTX 36 (SUTURE) IMPLANT
SUT PROLENE 3 0 SH 1 (SUTURE) ×4 IMPLANT
SUT PROLENE 3 0 SH DA (SUTURE) ×10 IMPLANT
SUT PROLENE 4 0 RB 1 (SUTURE) ×7
SUT PROLENE 4 0 SH DA (SUTURE) ×10 IMPLANT
SUT PROLENE 4-0 RB1 .5 CRCL 36 (SUTURE) ×7 IMPLANT
SUT SILK  1 MH (SUTURE) ×2
SUT SILK 1 MH (SUTURE) ×2 IMPLANT
SUT STEEL 6MS V (SUTURE) IMPLANT
SUT STEEL STERNAL CCS#1 18IN (SUTURE) IMPLANT
SUT STEEL SZ 6 DBL 3X14 BALL (SUTURE) IMPLANT
SUT VIC AB 1 CTX 36 (SUTURE) ×1
SUT VIC AB 1 CTX36XBRD ANBCTR (SUTURE) ×1 IMPLANT
SUT VIC AB 2-0 CTX 27 (SUTURE) IMPLANT
SUT VIC AB 2-0 UR6 27 (SUTURE) ×2 IMPLANT
SYS ATRICLIP LAA EXCLUSION 45 (CLIP) IMPLANT
SYSTEM SAHARA CHEST DRAIN ATS (WOUND CARE) ×4 IMPLANT
TAPE CLOTH SURG 4X10 WHT LF (GAUZE/BANDAGES/DRESSINGS) ×4 IMPLANT
TOWEL OR 17X24 6PK STRL BLUE (TOWEL DISPOSABLE) ×4 IMPLANT
TOWEL OR 17X26 10 PK STRL BLUE (TOWEL DISPOSABLE) ×4 IMPLANT
TRAY FOLEY IC TEMP SENS 14FR (CATHETERS) IMPLANT
TUBING INSUFFLATION 10FT LAP (TUBING) ×4 IMPLANT
UNDERPAD 30X30 INCONTINENT (UNDERPADS AND DIAPERS) ×4 IMPLANT
WATER STERILE IRR 1000ML POUR (IV SOLUTION) ×8 IMPLANT

## 2012-01-04 NOTE — Anesthesia Preprocedure Evaluation (Signed)
Anesthesia Evaluation  Patient identified by MRN, date of birth, ID band  Reviewed: Allergy & Precautions, H&P , NPO status , Patient's Chart, lab work & pertinent test results, reviewed documented beta blocker date and time   History of Anesthesia Complications Negative for: history of anesthetic complications  Airway Mallampati: II TM Distance: >3 FB Neck ROM: Full    Dental  (+) Partial Upper, Partial Lower and Dental Advisory Given   Pulmonary former smoker (remote smoker) breath sounds clear to auscultation  Pulmonary exam normal       Cardiovascular hypertension, Pt. on medications +CHF + dysrhythmias Atrial Fibrillation + Valvular Problems/Murmurs (severe MR, severe TR) MR Rhythm:Irregular Rate:Normal  Cath: normal coronaries   Neuro/Psych negative neurological ROS     GI/Hepatic negative GI ROS, Neg liver ROS,   Endo/Other  Diabetes mellitus-, Type 2, Oral Hypoglycemic Agents  Renal/GU negative Renal ROS     Musculoskeletal   Abdominal (+) + obese,   Peds  Hematology negative hematology ROS (+)   Anesthesia Other Findings   Reproductive/Obstetrics                           Anesthesia Physical Anesthesia Plan  ASA: IV  Anesthesia Plan: General   Post-op Pain Management:    Induction: Intravenous  Airway Management Planned: Oral ETT  Additional Equipment: Arterial line, PA Cath and TEE  Intra-op Plan:   Post-operative Plan: Post-operative intubation/ventilation  Informed Consent: I have reviewed the patients History and Physical, chart, labs and discussed the procedure including the risks, benefits and alternatives for the proposed anesthesia with the patient or authorized representative who has indicated his/her understanding and acceptance.   Dental advisory given  Plan Discussed with: CRNA and Surgeon  Anesthesia Plan Comments: (Plan routine monitors, A-line, PA  catheter, TEE with GETA, post op ventilation)        Anesthesia Quick Evaluation

## 2012-01-04 NOTE — Progress Notes (Signed)
*  PRELIMINARY RESULTS* Echocardiogram Echocardiogram Transesophageal has been performed.  Katheren Puller 01/04/2012, 9:47 AM

## 2012-01-04 NOTE — Anesthesia Procedure Notes (Signed)
Procedure Name: Intubation Date/Time: 01/04/2012 8:15 AM Performed by: Wray Kearns A Pre-anesthesia Checklist: Patient identified, Timeout performed, Emergency Drugs available, Suction available and Patient being monitored Patient Re-evaluated:Patient Re-evaluated prior to inductionOxygen Delivery Method: Circle system utilized Preoxygenation: Pre-oxygenation with 100% oxygen Intubation Type: IV induction Ventilation: Mask ventilation without difficulty Laryngoscope Size: Mac and 3 Grade View: Grade I Tube type: Oral Tube size: 8.0 mm Number of attempts: 1 Airway Equipment and Method: Stylet Placement Confirmation: ETT inserted through vocal cords under direct vision,  breath sounds checked- equal and bilateral,  positive ETCO2 and CO2 detector Secured at: 22 cm Tube secured with: Tape Dental Injury: Teeth and Oropharynx as per pre-operative assessment

## 2012-01-04 NOTE — Brief Op Note (Signed)
01/04/2012  1:43 Silva  PATIENT:  Tara Silva  78 y.o. female  PRE-OPERATIVE DIAGNOSIS:  MITRAL REGURGITATION, TRISCUPID REGURGITATION, A FIB  POST-OPERATIVE DIAGNOSIS:  MITRAL REGURGITATION, TRISCUPID REGURGITATION, A FIB  PROCEDURE:  Procedure(s) (LRB): MITRAL VALVE REPAIR (MVR) (N/A) TRICUSPID VALVE REPAIR (N/A) MAZE (N/A) PLACEMENT LV EPICARDIAL PACEMAKER LEADS  SURGEON:    Purcell Nails, MD  ASSISTANTS:  Bartholome Bill, CRNFA  ANESTHESIA:   Germaine Pomfret, MD  CROSSCLAMP TIME:   70' CARDIOPULMONARY BYPASS TIME: 146'  FINDINGS:  Hypertensive cardiomyopathy with EF 40%  Type I mitral valve dysfunction with moderate (3+) mitral regurgitation  Type I tricuspid valve dysfunction with moderate (3+) tricuspid regurgitation  No residual mitral regurgitation following valve repair  Trace residual tricuspid regurgitation following valve repair  COMPLICATIONS: none  PATIENT DISPOSITION:   TO SICU IN STABLE CONDITION  OWEN,Tara Silva

## 2012-01-04 NOTE — Preoperative (Signed)
Beta Blockers   Reason not to administer Beta Blockers:Pt took Metoprolol 12.5 mg PO @ 1430 hr  on 01/04/2012

## 2012-01-04 NOTE — Interval H&P Note (Signed)
History and Physical Interval Note:  01/04/2012 7:45 AM  Tara Silva  has presented today for surgery, with the diagnosis of MITRAL REGURGITATION, TRISCUPID REGURGITATION, A FIB  The various methods of treatment have been discussed with the patient and family. After consideration of risks, benefits and other options for treatment, the patient has consented to  Procedure(s) (LRB): MITRAL VALVE REPAIR (MVR) (N/A) TRICUSPID VALVE REPAIR (N/A) MAZE (N/A) as a surgical intervention .  The patients' history has been reviewed, patient examined, no change in status, stable for surgery.  I have reviewed the patients' chart and labs.  Questions were answered to the patient's satisfaction.     Avila Albritton H

## 2012-01-04 NOTE — Procedures (Signed)
Extubation Procedure Note  Patient Details:   Name: Tara Silva DOB: September 28, 1934 MRN: [id number]   Airway Documentation:  Airway (Active)  Secured at (cm) 22 cm 01/04/2012  8:30 PM  Measured From Lips 01/04/2012  8:30 PM  Secured Location Right 01/04/2012  8:30 PM  Secured By Pink Tape 01/04/2012  8:30 PM  Site Condition Dry 01/04/2012  8:30 PM    Evaluation  O2 sats: stable throughout Complications: No apparent complications Patient did tolerate procedure well. Bilateral Breath Sounds: Clear   Yes Pt. Was extubated to a 4L [place] without any complications, dyspnea or stridor noted. Pt. Is instructed on IS X 5. Highest goal achieved was . Pt. Achieved 1.5L on VC & -40 on NIF. Pt. Is doing well at this time. All vitals remain within normal limits.  Sheryle Hail 01/04/2012, 09:20PM

## 2012-01-04 NOTE — Op Note (Signed)
CARDIOTHORACIC SURGERY OPERATIVE NOTE  Date of Procedure:  01/04/2012  Preoperative Diagnosis:   Mitral Regurgitation  Tricuspid Regurgitation  Recurrent Persistent Atrial Fibrillation  Postoperative Diagnosis: Same  Procedure:   Mitral Valve Repair (Sorin Memo 3D Ring Annuloplasty, size 26mm, catalog #[id number], serial [id number])  Tricuspid Valve Repair (Edwards mc3 Ring Annuloplasty, size 26mm, model #4900, serial [id number])  Maze Procedure (complete biatrial lesion set using bipolar radiofrequency and cryothermy ablation with clipping of left atrial appendage using 40mm Atricure left atrial appendage clip)  Placement of LV epicardial pacemaker leads x2   Surgeon: Salvatore Decent. Cornelius Moras, MD  Assistant: Bartholome Bill, CRNFA  Anesthesia: Germaine Pomfret, MD  Operative Findings:  Hypertensive cardiomyopathy with EF 40%   Type I mitral valve dysfunction with moderate (3+) mitral regurgitation   Type I tricuspid valve dysfunction with moderate (3+) tricuspid regurgitation   No residual mitral regurgitation following valve repair   Trace residual tricuspid regurgitation following valve repair    BRIEF CLINICAL NOTE AND INDICATIONS FOR SURGERY  Patient is 78 year old recently widowed white female from [place] with long-standing history of mitral regurgitation, atrial fibrillation, and congestive heart failure. Patient states that she was first noted to have an irregular heart rhythm at least 8 years ago when she presented with severe anemia and congestive heart failure. In May of 2009 she was hospitalized with congestive heart failure and rapid atrial fibrillation. Echocardiogram performed at that time confirming the presence of mitral regurgitation with normal left ventricular systolic function. Since then the patient has developed progressive worsening symptoms of exertional shortness of breath and fatigue. In June 2012 she was hospitalized in [facility] with class IV congestive  heart failure and respiratory failure. Echocardiogram performed at that time demonstrated severe global left ventricular dysfunction with severe mitral regurgitation and moderate tricuspid regurgitation. The patient underwent left and right heart catheterization demonstrating normal coronary artery anatomy with no significant coronary artery disease. The patient was seen in consultation at that time by Dr. Erby Pian and told that she probably would not tolerate surgery under the circumstances. The patient was treated medically and did reasonably well for a period of time, but she was rehospitalized in January 2013 with rapid atrial fibrillation class 4 congestive heart failure and pulmonary edema. I had the opportunity to see the patient in consultation on 10/30/2011 during that hospitalization. She had transesophageal echocardiogram performed on 11/01/2011. This revealed severe left ventricular dysfunction with severe mitral regurgitation and moderate to severe tricuspid regurgitation. Right heart catheterization was performed at that time demonstrating pulmonary artery pressure 33/17 with pulmonary catheter wedge pressure mean of 15 and large V waves. Cardiac output ranged between 3.1 and 3.4 L per minute corresponding to a cardiac index between 1.9 and 2.0. Mixed venous oxygen saturation was 55%. Central venous pressure was 5. Since then the patient has remained clinically stable from a cardiovascular standpoint. She underwent dental extraction a dental cleaning. She now desires to proceed with elective surgery.  The patient has been seen in consultation and counseled at length regarding the indications, risks and potential benefits of surgery.  All questions have been answered, and the patient provides full informed consent for the operation as described.    DETAILS OF THE OPERATIVE PROCEDURE  The patient is brought to the operating room on the above mentioned date and central monitoring was established  by the anesthesia team including placement of Swan-Ganz catheter and radial arterial line. The patient is placed in the supine position on the operating table.  Intravenous antibiotics are administered. General endotracheal anesthesia is induced uneventfully. A Foley catheter is placed.  Baseline transesophageal echocardiogram was performed.  Findings were notable for moderate LVH with moderate LV systolic dysfunction, EF 40%.  There was biatrial enlargement.  There was type I mitral valve dysfunction (pure annular dilatation) with moderate (3+) mitral regurgitation.  There was type I tricuspid valve dysfunction with moderate (3+) tricuspid regurgitation.    The patient's chest, abdomen, both groins, and both lower extremities are prepared and draped in a sterile manner. A time out procedure is performed.  A median sternotomy incision was performed and the pericardium is opened. The ascending aorta is normal in appearance. The right common femoral vein is cannulated using the Seldinger technique and a guidewire advanced into the right atrium using TEE guidance.  The patient is heparinized systemically and the femoral vein cannulated using a 22 Fr long femoral venous cannula.  The ascending aorta is cannulated for cardiopulmonary bypass.  Adequate heparinization is verified.   A retrograde cardioplegia cannula is placed through the right atrium into the coronary sinus.   The entire pre-bypass portion of the operation was notable for stable hemodynamics.  Cardiopulmonary bypass was begun and the surface of the heart is inspected.  A second venous cannula is placed directly into the superior vena cava.   A cardioplegia cannula is placed in the ascending aorta.  A temperature probe was placed in the interventricular septum.  The Atricure cryothermy system is utilized for all cryothermy ablation lesions for the maze procedure.  The right atrial lateral wall lesions are placed including a longitudinal line  along the lateral wall from the superior vena cava to the inferior vena cava.  A second lesion is then placed extending from the midportion of the first lesion in an anterior and inferior direction to reach the acute margin of the heart.  The patient is cooled to 32C systemic temperature.  The aortic cross clamp is applied and cold blood cardioplegia is delivered initially in an antegrade fashion through the aortic root.  Supplemental cardioplegia is given retrograde through the coronary sinus catheter.  Iced saline slush is applied for topical hypothermia.  The initial cardioplegic arrest is rapid with early diastolic arrest.  Repeat doses of cardioplegia are administered intermittently throughout the entire cross clamp portion of the operation through the aortic root and through the coronary sinus catheter in order to maintain completely flat electrocardiogram and septal myocardial temperature below 15C.  Myocardial protection was felt to be excellent.  The Atricure bipolar radiofrequency ablation clamp is used for all radiofrequency ablation lesions for the maze procedure.  The heart is retracted towards the surgeon's side and the left sided pulmonary veins exposed.  An elliptical ablation lesion is created around the base of the left sided pulmonary veins.  A similar elliptical lesion was created around the base of the left atrial appendage.  The left atrial appendage was obliterated by deploying a 40 mm Atricure left atrial appendage clip across the appendage.    Two unipolar screw-in epicardial pacemaker leads (Medtronic model A3891613, serial #'s [id number] and [id number]) were implanted directly onto the lateral wall of the LV between the obtuse marginal branches anticipating the potential need for biventricular pacing resynchronization therapy in the future.    The heart was replaced into the pericardial sac.  A left atriotomy incision was performed through the interatrial groove and extended  partially across the back wall of the left atrium after opening the oblique sinus inferiorly.  The floor of the left atrium and the mitral valve were exposed using a self-retaining retractor.  The mitral valve was inspected and notable for normal leaflet anatomy and mobility.    An ablation lesion was placed around the right sided pulmonary veins using the bipolar clamp with one limb of the clamp along the endocardial surface and one along the epicardial surface posteriorly.  A bipolar ablation lesion was placed across the dome of the left atrium from the cephalad apex of the atriotomy incision to reach the cephalad apex of the elliptical lesion around the left sided pulmonary veins.  A similar bipolar lesion was placed across the back wall of the left atrium from the caudad apex of the atriotomy incision to reach the caudad apex of the elliptical lesion around the left sided pulmonary veins, thereby completing a box.  Finally another bipolar lesion was placed across the back wall of the left atrium from the caudad apex of the atriotomy incision towards the posterior mitral valve annulus.  This lesion was completed along the endocardial surface onto the posterior mitral annulus with a 3 minute duration cryothermy lesion, followed by a second cryothermy lesion along the posterior epicardial surface of the left atrium to the coronary sinus.  This completes the entire left side lesion set of the Cox maze procedure.  Interrupted 2-0 Ethibond horizontal mattress sutures are placed circumferentially around the entire mitral valve annulus.  The valve is sized to accept a 26 mm annuloplasty ring based upon the distance between the two commissures, the height of the anterior leaflet, and the surface area of the anterior leaflet.  A Sorin Memo 3D annuloplasty ring (size 26mm, catalog [id number], serial [id number]) was implanted.  The valve was tested with saline and appeared perfectly competent with no residual leak.  The  atriotomy was closed using a 2-layer closure of running 3-0 Prolene suture after placing a sump drain across the mitral valve to serve as a left ventricular vent.    The inferior vena cava cannula was pulled down until the tip was just below the junction between the right atrium and the inferior vena cava. An oblique incision is made in the right atrium. Traction sutures are placed to facilitate exposure of the tricuspid valve. The tricuspid valve is inspected carefully. The tricuspid valve leaflets appear normal with normal mobility and no sign of any fibrosis or thickening.   Cryothermy is used to perform an ablation lesion from the anterior apex of the right atriotomy incision along the endocardial surface of the right atrium across the acute margin to the tricuspid annulus at the 2 o'clock position.  One final ablation lesion was then made from the tip of the right atrial appendage to the tricuspid annulus at the 10 o'clock position.  This completes the entrie right side lesion set of the Cox maze procedure.  One final dose of warm retrograde "hot shot" cardioplegia was administered retrograde through the coronary sinus catheter while all air was evacuated through the aortic root.  The aortic cross clamp was removed after a total cross clamp time of 83 minutes.  Tricuspid ring annuloplasty is performed using interrupted 2-0 Ethibond horizontal mattress sutures placed circumferentially around the tricuspid annulus with exception of the area immediately below the triangle of Koch. The valve was sized to accept a 26 mm annuloplasty ring based upon the overall surface area of the combined anterior and posterior leaflets. An [facility] 3 tricuspid annuloplasty ring (size 26mm, model #4900, serial [id number]) is implanted  uneventfully. After completion of the annuloplasty the valve was tested with saline and appears to be competent. The right atriotomy incision is closed using a 2 layer closure of running 4-0  Prolene suture.  Epicardial pacing wires are fixed to the right ventricular outflow tract and to the right atrial appendage. The patient is rewarmed to 37C temperature. The aortic and left ventricular vents are removed.  The patient is weaned and disconnected from cardiopulmonary bypass.  The patient's rhythm at separation from bypass was AV paced.  The patient was weaned from cardioplegic bypass without any inotropic support. Total cardiopulmonary bypass time for the operation was 146 minutes.  Followup transesophageal echocardiogram performed after separation from bypass revealed  a well-seated mitral annuloplasty ring with normal functioning valve and no mitral regurgitation.  Mean gradient across the mitral valve was estimated 3 mmHg.  There was a well seated tricuspid annuloplasty ring with trace residual tricuspid regurgitation.  Left ventricular function was unchanged from preoperatively.  The aortic and superior vena cava cannula were removed uneventfully. Protamine was administered to reverse the anticoagulation. The femoral venous cannula was removed and manual pressure held on the groin for 30 minutes.  The mediastinum and pleural space were inspected for hemostasis and irrigated with saline solution. The mediastinum and both pleural spaces were drained using 4 chest tubes placed through separate stab incisions inferiorly.  The soft tissues anterior to the aorta were reapproximated loosely.   The two LV epicardial pacemaker leads are each tested for appropriate function:  Lead [id number] V paced R wave 24mV, impedence 1089 ohms, pacing threshold 1.4 v @ 0.5 msec Lead [id number] V paced R wave 3.19mV, impedence 1047 ohms, pacing threshold 1.5 v @ 0.5 msec  The leads were capped and then tunneled through the left anterior chestwall and placed in a small pocket in the subcutaneous tissues.  The pocket was closed with absorbable sutures.  The sternum is closed with double strength sternal wire. The  soft tissues anterior to the sternum were closed in multiple layers and the skin is closed with a running subcuticular skin closure.  The patient tolerated the procedure well and is transported to the surgical intensive care in stable condition. There are no intraoperative complications. All sponge instrument and needle counts are verified correct at completion of the operation.   The post-bypass portion of the operation was notable for stable rhythm and hemodynamics.   No blood products were administered during the operation.   Salvatore Decent. Cornelius Moras MD 01/04/2012 2:54 PM

## 2012-01-04 NOTE — Progress Notes (Signed)
Patient ID: Tara Silva, female   DOB: May 07, 1934, 78 y.o.   MRN: [id number]   Filed Vitals:   01/04/12 1830 01/04/12 1837 01/04/12 1845 01/04/12 1900  BP:      Pulse: 79 79 79 80  Temp: 97.5 F (36.4 C) 97.3 F (36.3 C) 97.5 F (36.4 C) 97.5 F (36.4 C)  TempSrc:      Resp: 12 12 12 12   Height:      Weight:      SpO2: 100% 100% 100% 100%    CI=2.45 on dop 3  Still sleepy on vent. Urine output good CT output low.  CBC    Component Value Date/Time   WBC 15.6* 01/04/2012 1430   RBC 2.82* 01/04/2012 1430   HGB 9.2* 01/04/2012 1439   HCT 27.0* 01/04/2012 1439   PLT 128* 01/04/2012 1430   MCV 94.7 01/04/2012 1430   MCH 31.6 01/04/2012 1430   MCHC 33.3 01/04/2012 1430   RDW 13.0 01/04/2012 1430    BMET    Component Value Date/Time   NA 142 01/04/2012 1439   K 3.7 01/04/2012 1439   CL 101 01/01/2012 1048   CO2 21 01/01/2012 1048   GLUCOSE 82 01/04/2012 1439   BUN 22 01/01/2012 1048   CREATININE 1.02 01/01/2012 1048   CALCIUM 9.8 01/01/2012 1048   GFRNONAA 52* 01/01/2012 1048   GFRAA 61* 01/01/2012 1048    Wean vent as tolerated.

## 2012-01-04 NOTE — OR Nursing (Signed)
12:40pm called vol. Desk to inform family off pump, 1st call to SICU.  13:20pm 2nd call to SICU.

## 2012-01-04 NOTE — Transfer of Care (Signed)
Immediate Anesthesia Transfer of Care Note  Patient: Tara Silva  Procedure(s) Performed: Procedure(s) (LRB): MITRAL VALVE REPAIR (MVR) (N/A) TRICUSPID VALVE REPAIR (N/A) MAZE (N/A)  Patient Location: SICU  Anesthesia Type: General  Level of Consciousness: sedated and Patient remains intubated per anesthesia plan  Airway & Oxygen Therapy: Patient remains intubated per anesthesia plan and Patient placed on Ventilator (see vital sign flow sheet for setting)  Post-op Assessment:Report given to Liborio Nixon, RN .vss  Post vital signs: Reviewed and stable  Complications: No apparent anesthesia complications

## 2012-01-05 ENCOUNTER — Inpatient Hospital Stay (HOSPITAL_COMMUNITY): Payer: Medicare Other

## 2012-01-05 ENCOUNTER — Encounter (HOSPITAL_COMMUNITY): Payer: Self-pay | Admitting: Thoracic Surgery (Cardiothoracic Vascular Surgery)

## 2012-01-05 LAB — POCT I-STAT, CHEM 8
Creatinine, Ser: 1.1 mg/dL (ref 0.50–1.10)
Glucose, Bld: 176 mg/dL — ABNORMAL HIGH (ref 70–99)
HCT: 29 % — ABNORMAL LOW (ref 36.0–46.0)
Hemoglobin: 9.9 g/dL — ABNORMAL LOW (ref 12.0–15.0)
TCO2: 25 mmol/L (ref 0–100)

## 2012-01-05 LAB — GLUCOSE, CAPILLARY
Glucose-Capillary: 108 mg/dL — ABNORMAL HIGH (ref 70–99)
Glucose-Capillary: 113 mg/dL — ABNORMAL HIGH (ref 70–99)
Glucose-Capillary: 87 mg/dL (ref 70–99)
Glucose-Capillary: 93 mg/dL (ref 70–99)
Glucose-Capillary: 91 mg/dL (ref 70–99)
Glucose-Capillary: 155 mg/dL — ABNORMAL HIGH (ref 70–99)
Glucose-Capillary: 182 mg/dL — ABNORMAL HIGH (ref 70–99)
Glucose-Capillary: 96 mg/dL (ref 70–99)
Glucose-Capillary: 129 mg/dL — ABNORMAL HIGH (ref 70–99)

## 2012-01-05 LAB — CBC
HCT: 29.2 % — ABNORMAL LOW (ref 36.0–46.0)
HCT: 30 % — ABNORMAL LOW (ref 36.0–46.0)
Hemoglobin: 9.7 g/dL — ABNORMAL LOW (ref 12.0–15.0)
Hemoglobin: 9.6 g/dL — ABNORMAL LOW (ref 12.0–15.0)
MCH: 31.5 pg (ref 26.0–34.0)
MCHC: 33.2 g/dL (ref 30.0–36.0)
RBC: 3.08 MIL/uL — ABNORMAL LOW (ref 3.87–5.11)
RDW: 13.4 % (ref 11.5–15.5)
WBC: 25.2 10*3/uL — ABNORMAL HIGH (ref 4.0–10.5)

## 2012-01-05 LAB — CREATININE, SERUM
GFR calc Af Amer: 68 mL/min — ABNORMAL LOW (ref >90)
GFR calc non Af Amer: 59 mL/min — ABNORMAL LOW (ref >90)

## 2012-01-05 LAB — BASIC METABOLIC PANEL
BUN: 17 mg/dL (ref 6–23)
Calcium: 8.9 mg/dL (ref 8.4–10.5)
GFR calc Af Amer: 82 mL/min — ABNORMAL LOW (ref >90)
GFR calc non Af Amer: 70 mL/min — ABNORMAL LOW (ref >90)
Glucose, Bld: 201 mg/dL — ABNORMAL HIGH (ref 70–99)

## 2012-01-05 LAB — MAGNESIUM
Magnesium: 2.5 mg/dL (ref 1.5–2.5)
Magnesium: 2.3 mg/dL (ref 1.5–2.5)

## 2012-01-05 MED ORDER — DOPAMINE-DEXTROSE 3.2-5 MG/ML-% IV SOLN
3.0000 ug/kg/min | INTRAVENOUS | Status: DC
  Administered 2012-01-05: 3 ug/kg/min via INTRAVENOUS

## 2012-01-05 MED ORDER — TRAMADOL HCL 50 MG PO TABS
50.0000 mg | ORAL_TABLET | Freq: Four times a day (QID) | ORAL | Status: DC | PRN
  Administered 2012-01-05 – 2012-01-12 (×12): 50 mg via ORAL
  Filled 2012-01-05 (×12): qty 1

## 2012-01-05 MED ORDER — INSULIN GLARGINE 100 UNIT/ML ~~LOC~~ SOLN
20.0000 [IU] | Freq: Every morning | SUBCUTANEOUS | Status: DC
  Administered 2012-01-05 – 2012-01-10 (×5): 20 [IU] via SUBCUTANEOUS

## 2012-01-05 MED ORDER — MORPHINE SULFATE 2 MG/ML IJ SOLN
1.0000 mg | INTRAMUSCULAR | Status: DC | PRN
  Administered 2012-01-06: 1 mg via INTRAVENOUS
  Filled 2012-01-05: qty 1

## 2012-01-05 MED ORDER — FUROSEMIDE 10 MG/ML IJ SOLN
20.0000 mg | Freq: Four times a day (QID) | INTRAMUSCULAR | Status: AC
  Administered 2012-01-05 (×3): 20 mg via INTRAVENOUS
  Filled 2012-01-05 (×4): qty 2

## 2012-01-05 MED ORDER — INSULIN ASPART 100 UNIT/ML ~~LOC~~ SOLN
0.0000 [IU] | SUBCUTANEOUS | Status: AC
  Administered 2012-01-05: 4 [IU] via SUBCUTANEOUS

## 2012-01-05 MED ORDER — INSULIN ASPART 100 UNIT/ML ~~LOC~~ SOLN
0.0000 [IU] | SUBCUTANEOUS | Status: DC
  Administered 2012-01-05: 2 [IU] via SUBCUTANEOUS
  Administered 2012-01-05 – 2012-01-06 (×2): 8 [IU] via SUBCUTANEOUS
  Administered 2012-01-06 (×2): 2 [IU] via SUBCUTANEOUS
  Administered 2012-01-06 (×2): 4 [IU] via SUBCUTANEOUS
  Administered 2012-01-07: 2 [IU] via SUBCUTANEOUS

## 2012-01-05 MED ORDER — INSULIN ASPART 100 UNIT/ML ~~LOC~~ SOLN
0.0000 [IU] | SUBCUTANEOUS | Status: DC
  Administered 2012-01-05: 2 [IU] via SUBCUTANEOUS

## 2012-01-05 NOTE — Progress Notes (Signed)
[facility] BRIEF SICU PROGRESS NOTE  1 Day Post-Op  S/P Procedure(s) (LRB): MITRAL VALVE REPAIR (MVR) (N/A) TRICUSPID VALVE REPAIR (N/A) MAZE (N/A)   Stable day AV paced  BP stable O2 sats 100% on 2 L/min via [place] Diuresing some  Plan: Continue current plan  Tara Silva H 01/05/2012 5:18 PM

## 2012-01-05 NOTE — Anesthesia Postprocedure Evaluation (Signed)
  Anesthesia Post-op Note  Patient: Tara Silva  Procedure(s) Performed: Procedure(s) (LRB): MITRAL VALVE REPAIR (MVR) (N/A) TRICUSPID VALVE REPAIR (N/A) MAZE (N/A)  Patient Location: ICU  Anesthesia Type: General  Level of Consciousness: awake, alert , oriented and patient cooperative  Airway and Oxygen Therapy: Patient Spontanous Breathing and Room Air  Post-op Pain: mild  Post-op Assessment: Post-op Vital signs reviewed, Patient's Cardiovascular Status Stable, Respiratory Function Stable, Patent Airway, No signs of Nausea or vomiting, Pain level controlled, No headache, No backache, No residual numbness and No residual motor weakness  Post-op Vital Signs: Reviewed and stable  Complications: No apparent anesthesia complications

## 2012-01-05 NOTE — Progress Notes (Signed)
   CARDIOTHORACIC SURGERY PROGRESS NOTE   R1 Day Post-Op Procedure(s) (LRB): MITRAL VALVE REPAIR (MVR) (N/A) TRICUSPID VALVE REPAIR (N/A) MAZE (N/A)  Subjective: Looks good.  Minimal pain. No SOB. No nausea.  Objective: Vital signs: BP Readings from Last 1 Encounters:  01/04/12 124/52   Pulse Readings from Last 1 Encounters:  01/05/12 79   Resp Readings from Last 1 Encounters:  01/05/12 16   Temp Readings from Last 1 Encounters:  01/05/12 98.1 F (36.7 C)     Hemodynamics: PAP: (18-40)/(3-22) 29/12 mmHg CO:  [2.8 L/min-4.3 L/min] 4 L/min CI:  [1.6 L/min/m2-2.5 L/min/m2] 2.3 L/min/m2  Physical Exam:  Rhythm:   Asystole - AV paced  Breath sounds: clear  Heart sounds:  RRR without murmur  Incisions:  Dressings dry  Abdomen:  Soft, non distended, non tender  Extremities:  Warm, well perfused   Intake/Output from previous day: 03/21 0701 - 03/22 0700 In: 6267 [I.V.:4957; Blood:400; NG/GT:60; IV Piggyback:850] Out: 6800 [Urine:5250; Blood:1100; Chest Tube:450] Intake/Output this shift:    Lab Results:  Basename 01/05/12 0424 01/04/12 2004 01/04/12 2000  WBC 23.0* -- 25.0*  HGB 9.7* 10.2* --  HCT 29.2* 30.0* --  PLT 169 -- 148*   BMET:  Basename 01/05/12 0424 01/04/12 2004  NA 139 142  K 4.8 4.7  CL 104 108  CO2 22 --  GLUCOSE 201* 147*  BUN 17 21  CREATININE 0.80 0.90  CALCIUM 8.9 --    CBG (last 3)   Basename 01/05/12 0518 01/05/12 0421 01/05/12 0420  GLUCAP 165* 155* 182*   ABG    Component Value Date/Time   PHART 7.398 01/04/2012 2058   HCO3 25.7* 01/04/2012 2058   TCO2 27 01/04/2012 2058   ACIDBASEDEF 2.0 01/04/2012 1434   O2SAT 98.0 01/04/2012 2058   CXR: clear  Assessment/Plan: S/P Procedure(s) (LRB): MITRAL VALVE REPAIR (MVR) (N/A) TRICUSPID VALVE REPAIR (N/A) MAZE (N/A)  Doing well POD1 Post-op bradycardia, not unexpected with h/o chronic Afib and LBBB pre-op Expected post op acute blood loss anemia, mild Expected post op  volume excess, mild Chronic kidney disease, creatinine below baseline and UOP good Type II diabetes with reasonable glycemic control   Mobilize  Diuresis  Wean dopamine off  D/C mediastinal tubes and leave pleural tubes for now  AV pace and keep in SICU while she remains pacer dependent  Consult EPS to consider perm pacer if rhythm doesn't recover over the weekend   [facility] H 01/05/2012 7:56 AM

## 2012-01-06 ENCOUNTER — Inpatient Hospital Stay (HOSPITAL_COMMUNITY): Payer: Medicare Other

## 2012-01-06 LAB — BASIC METABOLIC PANEL
Calcium: 8.8 mg/dL (ref 8.4–10.5)
GFR calc non Af Amer: 56 mL/min — ABNORMAL LOW (ref >90)
Sodium: 135 mEq/L (ref 135–145)

## 2012-01-06 LAB — GLUCOSE, CAPILLARY
Glucose-Capillary: 175 mg/dL — ABNORMAL HIGH (ref 70–99)
Glucose-Capillary: 212 mg/dL — ABNORMAL HIGH (ref 70–99)
Glucose-Capillary: 87 mg/dL (ref 70–99)

## 2012-01-06 LAB — CBC
MCH: 31.8 pg (ref 26.0–34.0)
Platelets: 100 10*3/uL — ABNORMAL LOW (ref 150–400)
RBC: 2.77 MIL/uL — ABNORMAL LOW (ref 3.87–5.11)
WBC: 19.2 10*3/uL — ABNORMAL HIGH (ref 4.0–10.5)

## 2012-01-06 MED ORDER — WARFARIN - PHYSICIAN DOSING INPATIENT: Freq: Every day | Status: DC

## 2012-01-06 MED ORDER — ENOXAPARIN SODIUM 30 MG/0.3ML ~~LOC~~ SOLN
30.0000 mg | SUBCUTANEOUS | Status: DC
  Administered 2012-01-06 – 2012-01-11 (×6): 30 mg via SUBCUTANEOUS
  Filled 2012-01-06 (×6): qty 0.3

## 2012-01-06 MED ORDER — WARFARIN SODIUM 2 MG PO TABS: 2.0000 mg | ORAL_TABLET | Freq: Every day | ORAL | Status: DC

## 2012-01-06 MED ORDER — FUROSEMIDE 10 MG/ML IJ SOLN
40.0000 mg | Freq: Four times a day (QID) | INTRAMUSCULAR | Status: AC
  Administered 2012-01-06 – 2012-01-07 (×3): 40 mg via INTRAVENOUS
  Filled 2012-01-06 (×3): qty 4

## 2012-01-06 NOTE — Progress Notes (Signed)
All charting, assessments, treatments and medication administration given by SN have been supervised and verified to be correct from 1900 3/22 until 0730 3/23.  

## 2012-01-06 NOTE — Progress Notes (Addendum)
   CARDIOTHORACIC SURGERY PROGRESS NOTE   R2 Days Post-Op Procedure(s) (LRB): MITRAL VALVE REPAIR (MVR) (N/A) TRICUSPID VALVE REPAIR (N/A) MAZE (N/A)  Subjective: Looks very good but still pacer dependent.  Mild soreness in chest. No SOB.  Objective: Vital signs: BP Readings from Last 1 Encounters:  01/06/12 133/67   Pulse Readings from Last 1 Encounters:  01/06/12 80   Resp Readings from Last 1 Encounters:  01/06/12 26   Temp Readings from Last 1 Encounters:  01/06/12 97.8 F (36.6 C) Oral    Hemodynamics:    Physical Exam:  Rhythm:   AV paced  Breath sounds: clear  Heart sounds:  RRR without murmur  Incisions:  Clean and dry  Abdomen:  Soft, non distended, non tender  Extremities:  Warm, well perfused   Intake/Output from previous day: 03/22 0701 - 03/23 0700 In: 1122.9 [P.O.:540; I.V.:478.9; IV Piggyback:104] Out: 1980 [Urine:1470; Chest Tube:510] Intake/Output this shift: Total I/O In: 305 [P.O.:175; I.V.:80; IV Piggyback:50] Out: 105 [Urine:105]  Lab Results:  Basename 01/06/12 0350 01/05/12 1702 01/05/12 1700  WBC 19.2* -- 25.2*  HGB 8.8* 9.9* --  HCT 26.7* 29.0* --  PLT 100* -- 133*   BMET:  Basename 01/06/12 0350 01/05/12 1702 01/05/12 0424  NA 135 141 --  K 3.9 4.3 --  CL 100 105 --  CO2 25 -- 22  GLUCOSE 151* 176* --  BUN 22 20 --  CREATININE 0.96 1.10 --  CALCIUM 8.8 -- 8.9    CBG (last 3)   Basename 01/06/12 0733 01/05/12 2320 01/05/12 1949  GLUCAP 175* 130* 206*   ABG    Component Value Date/Time   PHART 7.398 01/04/2012 2058   HCO3 25.7* 01/04/2012 2058   TCO2 25 01/05/2012 1702   ACIDBASEDEF 2.0 01/04/2012 1434   O2SAT 98.0 01/04/2012 2058   CXR: clear  Assessment/Plan: S/P Procedure(s) (LRB): MITRAL VALVE REPAIR (MVR) (N/A) TRICUSPID VALVE REPAIR (N/A) MAZE (N/A)  Doing well POD 2 Preop chronic Afib with LBBB and bradycardia, postop pacer-dependent Expected post op acute blood loss anemia, mild, stable Expected  post op volume excess, mild, diuresing Pleural tubes still draining too much for removal Postop leukocytosis, likely reactive, resolving DMII with adequate glycemic control   Mobilize  Diuresis  Keep in SICU while pacer-dependent  Keep pleural tubes until output decreased  Low dose lovenox for DVT prophylaxis  Possible perm pacer early this week if rhythm doesn't increase  Matthew Cina H 01/06/2012 11:44 AM

## 2012-01-07 ENCOUNTER — Encounter (HOSPITAL_COMMUNITY): Payer: Self-pay | Admitting: Internal Medicine

## 2012-01-07 ENCOUNTER — Inpatient Hospital Stay (HOSPITAL_COMMUNITY): Payer: Medicare Other

## 2012-01-07 DIAGNOSIS — I442 Atrioventricular block, complete: Secondary | ICD-10-CM

## 2012-01-07 DIAGNOSIS — I428 Other cardiomyopathies: Secondary | ICD-10-CM

## 2012-01-07 LAB — BASIC METABOLIC PANEL
CO2: 25 mEq/L (ref 19–32)
Calcium: 9.2 mg/dL (ref 8.4–10.5)
Creatinine, Ser: 1.02 mg/dL (ref 0.50–1.10)
GFR calc Af Amer: 61 mL/min — ABNORMAL LOW (ref >90)
GFR calc non Af Amer: 52 mL/min — ABNORMAL LOW (ref >90)

## 2012-01-07 LAB — GLUCOSE, CAPILLARY
Glucose-Capillary: 219 mg/dL — ABNORMAL HIGH (ref 70–99)
Glucose-Capillary: 162 mg/dL — ABNORMAL HIGH (ref 70–99)

## 2012-01-07 LAB — CBC
MCH: 32 pg (ref 26.0–34.0)
MCHC: 33.6 g/dL (ref 30.0–36.0)
MCV: 95.3 fL (ref 78.0–100.0)
Platelets: 116 10*3/uL — ABNORMAL LOW (ref 150–400)
RDW: 13.3 % (ref 11.5–15.5)

## 2012-01-07 MED ORDER — CHLORHEXIDINE GLUCONATE 4 % EX LIQD
60.0000 mL | Freq: Once | CUTANEOUS | Status: AC
  Administered 2012-01-07: 4 via TOPICAL
  Filled 2012-01-07: qty 60

## 2012-01-07 MED ORDER — SODIUM CHLORIDE 0.9 % IR SOLN
80.0000 mg | Status: DC
  Filled 2012-01-07: qty 2

## 2012-01-07 MED ORDER — FUROSEMIDE 10 MG/ML IJ SOLN
40.0000 mg | Freq: Four times a day (QID) | INTRAMUSCULAR | Status: AC
  Administered 2012-01-07 (×3): 40 mg via INTRAVENOUS
  Filled 2012-01-07 (×3): qty 4

## 2012-01-07 MED ORDER — CHLORHEXIDINE GLUCONATE 4 % EX LIQD
60.0000 mL | Freq: Once | CUTANEOUS | Status: AC
  Administered 2012-01-08: 4 via TOPICAL
  Filled 2012-01-07: qty 60

## 2012-01-07 MED ORDER — POTASSIUM CHLORIDE CRYS ER 20 MEQ PO TBCR
40.0000 meq | EXTENDED_RELEASE_TABLET | Freq: Two times a day (BID) | ORAL | Status: AC
  Administered 2012-01-07 (×2): 40 meq via ORAL
  Filled 2012-01-07 (×2): qty 2

## 2012-01-07 MED ORDER — SODIUM CHLORIDE 0.45 % IV SOLN: INTRAVENOUS | Status: DC

## 2012-01-07 MED ORDER — CEFAZOLIN SODIUM-DEXTROSE 2-3 GM-% IV SOLR
2.0000 g | INTRAVENOUS | Status: DC
  Filled 2012-01-07: qty 50

## 2012-01-07 MED ORDER — DIPHENHYDRAMINE HCL 50 MG/ML IJ SOLN
12.5000 mg | Freq: Four times a day (QID) | INTRAMUSCULAR | Status: DC | PRN
  Administered 2012-01-07: 12.5 mg via INTRAVENOUS
  Filled 2012-01-07: qty 1

## 2012-01-07 MED ORDER — INSULIN ASPART 100 UNIT/ML ~~LOC~~ SOLN
0.0000 [IU] | SUBCUTANEOUS | Status: DC
  Administered 2012-01-07: 8 [IU] via SUBCUTANEOUS
  Administered 2012-01-07: 4 [IU] via SUBCUTANEOUS

## 2012-01-07 MED ORDER — INSULIN ASPART 100 UNIT/ML ~~LOC~~ SOLN: 0.0000 [IU] | SUBCUTANEOUS | Status: DC

## 2012-01-07 MED ORDER — SODIUM CHLORIDE 0.9 % IV SOLN
INTRAVENOUS | Status: DC
  Administered 2012-01-08: 06:00:00 via INTRAVENOUS

## 2012-01-07 NOTE — Progress Notes (Addendum)
   CARDIOTHORACIC SURGERY PROGRESS NOTE   R3 Days Post-Op Procedure(s) (LRB): MITRAL VALVE REPAIR (MVR) (N/A) TRICUSPID VALVE REPAIR (N/A) MAZE (N/A)  Subjective: Looks good.  Mild soreness in chest.  No SOB.  Eating well.  Objective: Vital signs: BP Readings from Last 1 Encounters:  01/07/12 131/71   Pulse Readings from Last 1 Encounters:  01/07/12 80   Resp Readings from Last 1 Encounters:  01/07/12 11   Temp Readings from Last 1 Encounters:  01/07/12 97.6 F (36.4 C) Oral    Hemodynamics:    Physical Exam:  Rhythm:   Sinus with complete heart block and very slow junctional escape  Breath sounds: clear  Heart sounds:  RRR without murmur  Incisions:  Clean and dry  Abdomen:  soft  Extremities:  warm   Intake/Output from previous day: 03/23 0701 - 03/24 0700 In: 1066 [P.O.:845; I.V.:163; IV Piggyback:58] Out: 2360 [Urine:1970; Chest Tube:390] Intake/Output this shift:    Lab Results:  Basename 01/07/12 0430 01/06/12 0350  WBC 17.2* 19.2*  HGB 8.8* 8.8*  HCT 26.2* 26.7*  PLT 116* 100*   BMET:  Basename 01/07/12 0430 01/06/12 0350  NA 136 135  K 3.5 3.9  CL 98 100  CO2 25 25  GLUCOSE 83 151*  BUN 29* 22  CREATININE 1.02 0.96  CALCIUM 9.2 8.8    CBG (last 3)   Basename 01/07/12 0729 01/06/12 2323 01/06/12 1952  GLUCAP 122* 194* 87   ABG    Component Value Date/Time   PHART 7.398 01/04/2012 2058   HCO3 25.7* 01/04/2012 2058   TCO2 25 01/05/2012 1702   ACIDBASEDEF 2.0 01/04/2012 1434   O2SAT 98.0 01/04/2012 2058   CXR: clear  Assessment/Plan: S/P Procedure(s) (LRB): MITRAL VALVE REPAIR (MVR) (N/A) TRICUSPID VALVE REPAIR (N/A) MAZE (N/A)  Doing well POD3 Still in complete heart block with slow escape, likely will need perm pacer Expected post op acute blood loss anemia, mild, stable Expected post op volume excess, mild, diuresing Post op leukocytosis, likely reactive, resolving Type II DM with stable glycemic control Pleural tubes  still draining some serous fluid   Mobilize  Diuresis  Leave pleural tubes one more day  Will keep NPO past MN tonight  Nahjae Hoeg H 01/07/2012 9:44 AM

## 2012-01-07 NOTE — Consult Note (Addendum)
Electrophysiology Consult Note    Patient ID: Tara Silva MRN: [id number], DOB/AGE: [birth year] 78 y.o.  Admit date: 01/04/2012 Date of Consult: 01/07/2012  Primary Physician: Jolene Provost, MD, MD Primary Cardiologist: [facility]  Chief Complaint: complete heart block Reason for Consultation: needs pacer  HPI: 78 y/o admitted for MV and TV repair and concomitant maze in setting of severe LV dysfunction  She hs CHB with escape rhythm now 3 days post op At the time of her surgery two LV leads placed epicardially   Past Medical History  Diagnosis Date  . Congestive heart failure (CHF)     chronic systolic and diastolic  . Pulmonary hypertension   . Hypertension   . Anemia     chronic  . Chronic kidney disease     chronic renal insufficiency  . Hyperlipidemia   . Mitral regurgitation     s/p repair  . Tricuspid regurgitation     s/p repair  . Heart murmur   . Shortness of breath   . Left bundle branch block   . Atrial fibrillation   . Diabetes mellitus     Type 2 Niddm X 15 years  . Chronic renal disease, stage III   . Irritable bowel syndrome with diarrhea   . Arthritis   . Anxiety   . Supplemental oxygen dependent     2 l @ night;nasal cannula  . S/P mitral valve repair 01/04/2012    26mm Sorin Memo 3D ring annuloplasty for type I mitral valve dysfunction  . S/P tricuspid valve repair 01/04/2012    26mm Edwards mc3 ring annuloplasty for type I tricuspid valve dysfunction  . S/P Maze operation for atrial fibrillation 01/04/2012    Complete biatrial lesion set using bipolar radiofrequency and cryothermy ablation with clipping of LA appendage      Surgical History:  Past Surgical History  Procedure Date  . Replacement total knee 2004    Right knee  . Bladder suspension   . Tonsillectomy   . Total abdominal hysterectomy w/ bilateral salpingoophorectomy   . Joint replacement 2007    Right knee  . Cardiac catheterization 10/2011  . Abdominal hysterectomy   .  Mitral valve repair 01/04/2012    Procedure: MITRAL VALVE REPAIR (MVR);  Surgeon: Purcell Nails, MD;  [facility];  Service: Open Heart Surgery;  Laterality: N/A;  . Tricuspid valve replacement 01/04/2012    Procedure: TRICUSPID VALVE REPAIR;  Surgeon: Purcell Nails, MD;  Location: [facility] OR;  Service: Open Heart Surgery;  Laterality: N/A;  . Maze 01/04/2012    Procedure: MAZE;  Surgeon: Purcell Nails, MD;  Location: [facility] OR;  Service: Open Heart Surgery;  Laterality: N/A;     Prescriptions prior to admission  Medication Sig Dispense Refill  . ALPRAZolam (XANAX) 0.25 MG tablet Take 0.25 mg by mouth 2 (two) times daily as needed. For sleep      . atorvastatin (LIPITOR) 40 MG tablet Take 40 mg by mouth at bedtime.       . dicyclomine (BENTYL) 20 MG tablet Take 20 mg by mouth 2 (two) times daily.       . digoxin (LANOXIN) 0.125 MG tablet Take 125 mcg by mouth daily.      . ferrous sulfate 325 (65 FE) MG tablet Take 325 mg by mouth daily with breakfast.      . furosemide (LASIX) 20 MG tablet Take 20 mg by mouth daily.      Marland Kitchen gemfibrozil (LOPID) 600  MG tablet Take 600 mg by mouth 2 (two) times daily before a meal.      . losartan (COZAAR) 100 MG tablet Take 50 mg by mouth daily.       . metoprolol succinate (TOPROL-XL) 100 MG 24 hr tablet Take 100 mg by mouth daily. Take with or immediately following a meal.      . metoprolol succinate (TOPROL-XL) 25 MG 24 hr tablet Take 25 mg by mouth at bedtime.      . promethazine (PHENERGAN) 12.5 MG tablet Take 12.5 mg by mouth as needed. For nausea.      . saxagliptin HCl (ONGLYZA) 5 MG TABS tablet Take 5 mg by mouth daily.      . traMADol (ULTRAM) 50 MG tablet Take 50 mg by mouth 2 (two) times daily as needed. For pain      . vitamin B-12 (CYANOCOBALAMIN) 500 MCG tablet Take 500 mcg by mouth 2 (two) times daily.        Inpatient Medications:     . acetaminophen  1,000 mg Oral Q6H   Or  . acetaminophen (TYLENOL) oral liquid 160 mg/5 mL  975 mg Per  Tube Q6H  . bisacodyl  10 mg Oral Daily   Or  . bisacodyl  10 mg Rectal Daily  . Chlorhexidine Gluconate Cloth  6 each Topical Daily  . docusate sodium  200 mg Oral Daily  . enoxaparin (LOVENOX) injection  30 mg Subcutaneous Q24H  . furosemide  40 mg Intravenous Q6H  . furosemide  40 mg Intravenous Q6H  . insulin aspart  0-24 Units Subcutaneous Q4H  . insulin glargine  20 Units Subcutaneous q morning - 10a  . pantoprazole  40 mg Oral Q1200  . potassium chloride  40 mEq Oral BID  . sodium chloride  3 mL Intravenous Q12H  . DISCONTD: insulin aspart  0-24 Units Subcutaneous Q4H  . DISCONTD: insulin aspart  0-24 Units Subcutaneous Q4H  . DISCONTD: Warfarin - Physician Dosing Inpatient   Does not apply q1800    Allergies:  Allergies  Allergen Reactions  . Aspirin Palpitations  . Sudafed (Pseudoephedrine Hcl) Other (See Comments)    "Scalp crawls"    History   Social History  . Marital Status: Widowed    Spouse Name: N/A    Number of Children: N/A  . Years of Education: N/A   Occupational History  . Not on file.   Social History Main Topics  . Smoking status: Former Smoker -- 0.5 packs/day for 2 years    Types: Cigarettes    Quit date: 10/17/1923  . Smokeless tobacco: Former Neurosurgeon    Types: Chew    Quit date: 10/16/1990  . Alcohol Use: No  . Drug Use: No  . Sexually Active: No   Other Topics Concern  . Not on file   Social History Narrative  . No narrative on file     Family History  Problem Relation Age of Onset  . Anesthesia problems Neg Hx      Review of Systems:  General: decreased appetite, normal energy  Respiratory: no cough, no wheezing, no hemoptysis, no pain with inspiration or cough, no shortness of breath  Cardiac: no chest pain or tightness, + exertional SOB, no resting SOB, no PND, + orthopnea, no LE edema, no palpitations, no syncope  GI: no difficulty swallowing, no hematochezia, no hematemesis, no melena, + constipation, no diarrhea  GU: no  dysuria, no urgency, + frequency  Musculoskeletal: + arthritis, mild,  ambulates well without assistance  Vascular: no pain suggestive of claudication  Neuro: no symptoms suggestive of TIA's, no seizures, no headaches, no peripheral neuropathy  Endocrine: Negative, blood sugars reportedly well controlled  HEENT: no loose teeth or painful teeth, no recent vision changes  Psych: + anxiety, no depression      Physical Exam:  Blood pressure 127/67, pulse 80, temperature 97.6 F (36.4 C), temperature source Oral, resp. rate 17, height 5\' 2"  (1.575 m), weight 148 lb 13 oz (67.5 kg), SpO2 100.00%.  General appearance: alert, cooperative and mild distress HEENT:PERRLA Resp: rhonchi bilatterlaly on the sides Chest wall: no tenderness, sore post op Cardio:regular rate & rhythm and friction rub GI: soft, non-tender; bowel sounds normal; no masses,  no organomegaly Extremities: edema trace Pulses:  "2+ and symmetric"}  Neuro:Alert and oriented x3. Gait normal. Reflexes and motor strength normal and symmetric. Cranial nerves 2-12 and sensation grossly intact. Psychnormal affect   Tel AV pacing  Underlying rhythm:complete heart block with ventricular escape at 40  Basic Metabolic Panel:  Lab 01/07/12 1610 01/06/12 0350 01/05/12 1702 01/05/12 1700 01/05/12 0424 01/04/12 2004 01/04/12 2000 01/04/12 1439 01/04/12 1250 01/01/12 1048  NA 136 135 141 -- 139 142 -- 142 141 --  K 3.5 3.9 4.3 -- 4.8 4.7 -- 3.7 3.8 --  CL 98 100 105 -- 104 108 -- -- -- 101  CO2 25 25 -- -- 22 -- -- -- -- 21  GLUCOSE 83 151* 176* -- 201* 147* -- 82 186* --  BUN 29* 22 20 -- 17 21 -- -- -- 22  CREATININE 1.02 0.96 1.10 0.93 0.80 0.90 0.84 -- -- --  CALCIUM 9.2 8.8 -- -- -- -- -- -- -- --  MG -- -- -- 2.3 2.5 -- -- -- -- --  PHOS -- -- -- -- -- -- -- -- -- --   Cardiac Enzymes: No results found for this basename: CKTOTAL:3,CKMB:3,CKMBINDEX:3,TROPONINI:3 in the last 72 hours CBC:  Lab 01/07/12 0430 01/06/12 0350  01/05/12 1702 01/05/12 1700 01/05/12 0424 01/04/12 2004 01/04/12 2000 01/04/12 1430 01/04/12 1126 01/01/12 1048  WBC 17.2* 19.2* -- 25.2* 23.0* -- 25.0* 15.6* -- 10.8*  NEUTROABS -- -- -- -- -- -- -- -- -- --  HGB 8.8* 8.8* 9.9* 9.6* 9.7* 10.2* 10.0* -- -- --  HCT 26.2* 26.7* 29.0* 30.0* 29.2* 30.0* 29.5* -- -- --  MCV 95.3 96.4 -- 97.4 94.8 -- 93.7 94.7 -- 95.6  PLT 116* 100* -- 133* 169 -- 148* 128* 119* --       Radiology/Studies: Dg Chest 2 View  01/01/2012  *RADIOLOGY REPORT*  Clinical Data: Pre mitral valve replacement  CHEST - 2 VIEW  Comparison: None.  Findings: Lungs are clear. No pleural effusion or pneumothorax.  The heart is top normal in size/mildly enlarged.  Mild degenerative changes of the visualized thoracolumbar spine.  IMPRESSION: No evidence of acute cardiopulmonary disease.  Original Report Authenticated By: Charline Bills, M.D.   Dg Chest Portable 1 View  01/07/2012  *RADIOLOGY REPORT*  Clinical Data: Chest tubes in place.  Mitral valve repair. Tricuspid valve replacement.  Congestive heart failure.  PORTABLE CHEST - 1 VIEW  Comparison: 01/06/2012  Findings: Removal of right IJ Cordis sheath.  Bilateral chest tubes remain in place.  The right apical pneumothorax is decreased and may have resolved.  Cannot exclude less than 5% residua.  No left- sided pneumothorax identified.  Mild cardiomegaly.  Mild right hemidiaphragm elevation.  No pleural fluid.  Interstitial  edema is moderate and similar.  Left greater than right bibasilar airspace disease is not significantly changed.  IMPRESSION:  1.  Chest tubes in place.  Improved versus resolved right apical pneumothorax.  No left-sided pneumothorax. 2.  Otherwise similar congestive heart failure with bibasilar atelectasis.  Original Report Authenticated By: Consuello Bossier, M.D.   Dg Chest Portable 1 View In Am  01/06/2012  *RADIOLOGY REPORT*  Clinical Data: Status post CABG.  Chest tubes.  PORTABLE CHEST - 1 VIEW  Comparison:  Chest 01/05/2012.  Findings: Swan-Ganz catheter has been removed with an IJ sheath still in place.  Bilateral chest tubes remain in place. Mediastinal drain has been removed.  The patient has a tiny right apical pneumothorax, slightly larger than on yesterday's exam.  No left pneumothorax is identified.  Small bilateral pleural effusions and basilar atelectasis appear worse on the left.  IMPRESSION:  1.  Support apparatus as described.  Tiny right apical pneumothorax is slightly larger than on the study yesterday. 2.  Basilar atelectasis and small effusions, greater on the left.  Original Report Authenticated By: Bernadene Bell. D'ALESSIO, M.D.   Dg Chest Portable 1 View In Am  01/05/2012  *RADIOLOGY REPORT*  Clinical Data: Postop CABG  PORTABLE CHEST - 1 VIEW  Comparison: 01/04/2012  Findings: Bilateral chest tubes, mediastinal drains and a right internal jugular Swan-Ganz catheter are in place.  The Swan-Ganz catheter has been pulled back and the tip is now located in the region of the pulmonary outflow tract.  The patient is status post median sternotomy.  Heart size and mediastinal contours are stable.  The lung fields demonstrate mild pulmonary vascular congestion with no evidence for overt congestive failure.  No focal infiltrates or pleural effusions are seen.  A trace right apical pneumothorax is noted.  No definite pleural air is seen on the left.  IMPRESSION: Improved Swan-Ganz catheter position.  Trace right apical pneumothorax.  Otherwise stable postoperative cardiopulmonary appearance  Original Report Authenticated By: Bertha Stakes, M.D.   Dg Chest Portable 1 View  01/04/2012  *RADIOLOGY REPORT*  Clinical Data: Postop  PORTABLE CHEST - 1 VIEW  Comparison: Portable exam 1455 hours compared to 01/01/2012  Findings: Tip of endotracheal tube approximately 2.3 cm above carina. Nasogastric tube coiled in stomach. Mediastinal drain and bilateral thoracostomy tubes noted. Right jugular Swan-Ganz catheter,  tip peripherally positioned in right lower lobe pulmonary artery. Epicardial pacing wires noted. Left atrial appendage occlusion clip present. Minimal enlargement of cardiac silhouette post interval median sternotomy and bivalvular replacement. Right basilar atelectasis. No pneumothorax.  IMPRESSION: Postoperative changes from cardiac valvular surgery as above. Tip of the right jugular Swan-Ganz catheter appears fairly far peripherally in the right lower lobe pulmonary artery, recommend repositioning.  I called these results to SICU and was informed by the patient's nurse that this catheter has already been repositioned.  Original Report Authenticated By: Lollie Marrow, M.D.       Patient Active Hospital Problem List:    Atrioventricular block, complete (01/07/2012)  Nonischemic cardiomyopathy (01/07/2012)  S/P tricuspid valve repair (01/04/2012)  S/P Maze operation for atrial fibrillation (01/04/2012) S/P mitral valve repair (01/04/2012)   Pt with valve repair of both AV valves and now with post op CHB   She is not likely to recover AV conduction that is stable but will reassess in am  Otherwise would proceed with pacemaker implantation.  She had LV leads placed at the time of surgery so I would anticipate [place] using them .  The issue of an ICD needs also be considered with LV dysfunction.  This is one of the [employer] acceptable alternative buckets   Dr Leonia Reeves can address with family in am as they are not  Here this afternoon  Orders written   Signed, Sherryl Manges MD   The benefits and risks were reviewed including but not limited to death,  perforation, infection, lead dislodgement and device malfunction.  The patient understands agrees and is willing to proceed.

## 2012-01-07 NOTE — Plan of Care (Signed)
Problem: Phase II Progression Outcomes Goal: Other Phase II Outcomes/Goals Outcome: Not Progressing Patient has return of intrinsic heart rhythm that will support adequate hemodynamics

## 2012-01-08 ENCOUNTER — Encounter (HOSPITAL_COMMUNITY)
Admission: RE | Disposition: A | Discharge status: Home Health | Payer: Self-pay | Source: Ambulatory Visit | Attending: Thoracic Surgery (Cardiothoracic Vascular Surgery)

## 2012-01-08 DIAGNOSIS — I442 Atrioventricular block, complete: Secondary | ICD-10-CM

## 2012-01-08 HISTORY — PX: PERMANENT PACEMAKER INSERTION: SHX5480

## 2012-01-08 LAB — GLUCOSE, CAPILLARY
Glucose-Capillary: 118 mg/dL — ABNORMAL HIGH (ref 70–99)
Glucose-Capillary: 95 mg/dL (ref 70–99)
Glucose-Capillary: 149 mg/dL — ABNORMAL HIGH (ref 70–99)
Glucose-Capillary: 131 mg/dL — ABNORMAL HIGH (ref 70–99)

## 2012-01-08 LAB — CBC
HCT: 26.1 % — ABNORMAL LOW (ref 36.0–46.0)
Hemoglobin: 8.6 g/dL — ABNORMAL LOW (ref 12.0–15.0)
MCHC: 33 g/dL (ref 30.0–36.0)

## 2012-01-08 LAB — BASIC METABOLIC PANEL
BUN: 30 mg/dL — ABNORMAL HIGH (ref 6–23)
Chloride: 95 mEq/L — ABNORMAL LOW (ref 96–112)
GFR calc Af Amer: 67 mL/min — ABNORMAL LOW (ref >90)
Potassium: 3.9 mEq/L (ref 3.5–5.1)

## 2012-01-08 SURGERY — PERMANENT PACEMAKER INSERTION
Anesthesia: LOCAL

## 2012-01-08 MED ORDER — INSULIN ASPART 100 UNIT/ML ~~LOC~~ SOLN
0.0000 [IU] | Freq: Three times a day (TID) | SUBCUTANEOUS | Status: DC
  Administered 2012-01-08: 2 [IU] via SUBCUTANEOUS

## 2012-01-08 MED ORDER — HEPARIN (PORCINE) IN NACL 2-0.9 UNIT/ML-% IJ SOLN
INTRAMUSCULAR | Status: AC
  Filled 2012-01-08: qty 1000

## 2012-01-08 MED ORDER — AMIODARONE HCL 200 MG PO TABS
200.0000 mg | ORAL_TABLET | Freq: Two times a day (BID) | ORAL | Status: DC
  Administered 2012-01-09 – 2012-01-12 (×7): 200 mg via ORAL
  Filled 2012-01-08 (×9): qty 1

## 2012-01-08 MED ORDER — CEFAZOLIN SODIUM 1-5 GM-% IV SOLN
1.0000 g | Freq: Four times a day (QID) | INTRAVENOUS | Status: AC
  Administered 2012-01-08 – 2012-01-09 (×3): 1 g via INTRAVENOUS
  Filled 2012-01-08 (×3): qty 50

## 2012-01-08 MED ORDER — WARFARIN SODIUM 2 MG PO TABS
2.0000 mg | ORAL_TABLET | Freq: Every day | ORAL | Status: DC
  Administered 2012-01-08 – 2012-01-11 (×4): 2 mg via ORAL
  Filled 2012-01-08 (×5): qty 1

## 2012-01-08 MED ORDER — FENTANYL CITRATE 0.05 MG/ML IJ SOLN
INTRAMUSCULAR | Status: AC
  Filled 2012-01-08: qty 2

## 2012-01-08 MED ORDER — ACETAMINOPHEN 325 MG PO TABS: 325.0000 mg | ORAL_TABLET | ORAL | Status: DC | PRN

## 2012-01-08 MED ORDER — LIDOCAINE HCL (PF) 1 % IJ SOLN
INTRAMUSCULAR | Status: AC
  Filled 2012-01-08: qty 60

## 2012-01-08 MED ORDER — PNEUMOCOCCAL VAC POLYVALENT 25 MCG/0.5ML IJ INJ: 0.5000 mL | INJECTION | INTRAMUSCULAR | Status: DC | PRN

## 2012-01-08 MED ORDER — WARFARIN - PHYSICIAN DOSING INPATIENT: Freq: Every day | Status: DC

## 2012-01-08 MED ORDER — ONDANSETRON HCL 4 MG/2ML IJ SOLN: 4.0000 mg | Freq: Four times a day (QID) | INTRAMUSCULAR | Status: DC | PRN

## 2012-01-08 MED FILL — Lidocaine HCl IV Inj 20 MG/ML: INTRAVENOUS | Qty: 5 | Status: AC

## 2012-01-08 MED FILL — Sodium Bicarbonate IV Soln 8.4%: INTRAVENOUS | Qty: 50 | Status: AC

## 2012-01-08 MED FILL — Electrolyte-R (PH 7.4) Solution: INTRAVENOUS | Qty: 4000 | Status: AC

## 2012-01-08 MED FILL — Sodium Chloride IV Soln 0.9%: INTRAVENOUS | Qty: 1000 | Status: AC

## 2012-01-08 MED FILL — Heparin Sodium (Porcine) Inj 1000 Unit/ML: INTRAMUSCULAR | Qty: 30 | Status: AC

## 2012-01-08 MED FILL — Sodium Chloride Irrigation Soln 0.9%: Qty: 3000 | Status: AC

## 2012-01-08 MED FILL — Heparin Sodium (Porcine) Inj 1000 Unit/ML: INTRAMUSCULAR | Qty: 10 | Status: AC

## 2012-01-08 NOTE — Progress Notes (Signed)
UR Completed.  Jenell Dobransky Jane [phone] 01/08/2012  

## 2012-01-08 NOTE — Progress Notes (Signed)
Patient ID: Tara Silva, female   DOB: 01/19/1934, 78 y.o.   MRN: [id number]   Filed Vitals:   01/08/12 1600 01/08/12 1700 01/08/12 1800 01/08/12 1900  BP: 120/70 134/66 139/108 138/72  Pulse: 78 73 80 79  Temp:      TempSrc:      Resp: 24 23 21 24   Height:      Weight:      SpO2: 100% 100% 100% 100%   Had permanent pacer today and currently pacing at 80.  No new problems

## 2012-01-08 NOTE — H&P (Signed)
Chief Complaint: complete heart block  Reason for Consultation: needs pacer  HPI: 78 y/o admitted for MV and TV repair and concomitant maze in setting of severe LV dysfunction She hs CHB with escape rhythm now 3 days post op  At the time of her surgery two LV leads placed epicardially  Past Medical History   Diagnosis  Date   .  Congestive heart failure (CHF)      chronic systolic and diastolic   .  Pulmonary hypertension    .  Hypertension    .  Anemia      chronic   .  Chronic kidney disease      chronic renal insufficiency   .  Hyperlipidemia    .  Mitral regurgitation      s/p repair   .  Tricuspid regurgitation      s/p repair   .  Heart murmur    .  Shortness of breath    .  Left bundle branch block    .  Atrial fibrillation    .  Diabetes mellitus      Type 2 Niddm X 15 years   .  Chronic renal disease, stage III    .  Irritable bowel syndrome with diarrhea    .  Arthritis    .  Anxiety    .  Supplemental oxygen dependent      2 l @ night;nasal cannula   .  S/P mitral valve repair  01/04/2012     26mm Sorin Memo 3D ring annuloplasty for type I mitral valve dysfunction   .  S/P tricuspid valve repair  01/04/2012     26mm Edwards mc3 ring annuloplasty for type I tricuspid valve dysfunction   .  S/P Maze operation for atrial fibrillation  01/04/2012     Complete biatrial lesion set using bipolar radiofrequency and cryothermy ablation with clipping of LA appendage    Surgical History:  Past Surgical History   Procedure  Date   .  Replacement total knee  2004     Right knee   .  Bladder suspension    .  Tonsillectomy    .  Total abdominal hysterectomy w/ bilateral salpingoophorectomy    .  Joint replacement  2007     Right knee   .  Cardiac catheterization  10/2011   .  Abdominal hysterectomy    .  Mitral valve repair  01/04/2012     Procedure: MITRAL VALVE REPAIR (MVR); Surgeon: Purcell Nails, MD; Location: [facility] OR; Service: Open Heart Surgery; Laterality: N/A;   .   Tricuspid valve replacement  01/04/2012     Procedure: TRICUSPID VALVE REPAIR; Surgeon: Purcell Nails, MD; Location: [facility] OR; Service: Open Heart Surgery; Laterality: N/A;   .  Maze  01/04/2012     Procedure: MAZE; Surgeon: Purcell Nails, MD; Location: [facility] OR; Service: Open Heart Surgery; Laterality: N/A;    Prescriptions prior to admission   Medication  Sig  Dispense  Refill   .  ALPRAZolam (XANAX) 0.25 MG tablet  Take 0.25 mg by mouth 2 (two) times daily as needed. For sleep     .  atorvastatin (LIPITOR) 40 MG tablet  Take 40 mg by mouth at bedtime.     .  dicyclomine (BENTYL) 20 MG tablet  Take 20 mg by mouth 2 (two) times daily.     .  digoxin (LANOXIN) 0.125 MG tablet  Take 125 mcg by mouth daily.     Marland Kitchen  ferrous sulfate 325 (65 FE) MG tablet  Take 325 mg by mouth daily with breakfast.     .  furosemide (LASIX) 20 MG tablet  Take 20 mg by mouth daily.     Marland Kitchen  gemfibrozil (LOPID) 600 MG tablet  Take 600 mg by mouth 2 (two) times daily before a meal.     .  losartan (COZAAR) 100 MG tablet  Take 50 mg by mouth daily.     .  metoprolol succinate (TOPROL-XL) 100 MG 24 hr tablet  Take 100 mg by mouth daily. Take with or immediately following a meal.     .  metoprolol succinate (TOPROL-XL) 25 MG 24 hr tablet  Take 25 mg by mouth at bedtime.     .  promethazine (PHENERGAN) 12.5 MG tablet  Take 12.5 mg by mouth as needed. For nausea.     .  saxagliptin HCl (ONGLYZA) 5 MG TABS tablet  Take 5 mg by mouth daily.     .  traMADol (ULTRAM) 50 MG tablet  Take 50 mg by mouth 2 (two) times daily as needed. For pain     .  vitamin B-12 (CYANOCOBALAMIN) 500 MCG tablet  Take 500 mcg by mouth 2 (two) times daily.      Inpatient Medications:   .  acetaminophen  1,000 mg  Oral  Q6H    Or   .  acetaminophen (TYLENOL) oral liquid 160 mg/5 mL  975 mg  Per Tube  Q6H   .  bisacodyl  10 mg  Oral  Daily    Or   .  bisacodyl  10 mg  Rectal  Daily   .  Chlorhexidine Gluconate Cloth  6 each  Topical  Daily   .   docusate sodium  200 mg  Oral  Daily   .  enoxaparin (LOVENOX) injection  30 mg  Subcutaneous  Q24H   .  furosemide  40 mg  Intravenous  Q6H   .  furosemide  40 mg  Intravenous  Q6H   .  insulin aspart  0-24 Units  Subcutaneous  Q4H   .  insulin glargine  20 Units  Subcutaneous  q morning - 10a   .  pantoprazole  40 mg  Oral  Q1200   .  potassium chloride  40 mEq  Oral  BID   .  sodium chloride  3 mL  Intravenous  Q12H   .  DISCONTD: insulin aspart  0-24 Units  Subcutaneous  Q4H   .  DISCONTD: insulin aspart  0-24 Units  Subcutaneous  Q4H   .  DISCONTD: Warfarin - Physician Dosing Inpatient   Does not apply  q1800    Allergies:  Allergies   Allergen  Reactions   .  Aspirin  Palpitations   .  Sudafed (Pseudoephedrine Hcl)  Other (See Comments)     "Scalp crawls"    History    Social History   .  Marital Status:  Widowed     Spouse Name:  N/A     Number of Children:  N/A   .  Years of Education:  N/A    Occupational History   .  Not on file.    Social History Main Topics   .  Smoking status:  Former Smoker -- 0.5 packs/day for 2 years     Types:  Cigarettes     Quit date:  10/17/1923   .  Smokeless tobacco:  Former Neurosurgeon  Types:  Dorna Bloom     Quit date:  10/16/1990   .  Alcohol Use:  No   .  Drug Use:  No   .  Sexually Active:  No    Other Topics  Concern   .  Not on file    Social History Narrative   .  No narrative on file    Family History   Problem  Relation  Age of Onset   .  Anesthesia problems  Neg Hx     Review of Systems:  General: decreased appetite, normal energy  Respiratory: no cough, no wheezing, no hemoptysis, no pain with inspiration or cough, no shortness of breath  Cardiac: no chest pain or tightness, + exertional SOB, no resting SOB, no PND, + orthopnea, no LE edema, no palpitations, no syncope  GI: no difficulty swallowing, no hematochezia, no hematemesis, no melena, + constipation, no diarrhea  GU: no dysuria, no urgency, + frequency    Musculoskeletal: + arthritis, mild, ambulates well without assistance  Vascular: no pain suggestive of claudication  Neuro: no symptoms suggestive of TIA's, no seizures, no headaches, no peripheral neuropathy  Endocrine: Negative, blood sugars reportedly well controlled  HEENT: no loose teeth or painful teeth, no recent vision changes  Psych: + anxiety, no depression  Physical Exam:  Blood pressure 127/67, pulse 80, temperature 97.6 F (36.4 C), temperature source Oral, resp. rate 17, height 5\' 2"  (1.575 m), weight 148 lb 13 oz (67.5 kg), SpO2 100.00%.  General appearance: alert, cooperative and mild distress  HEENT:PERRLA  Resp: rhonchi bilatterlaly on the sides  Chest wall: no tenderness, sore post op  Cardio:regular rate & rhythm and friction rub  GI: soft, non-tender; bowel sounds normal; no masses, no organomegaly  Extremities: edema trace  Pulses: "2+ and symmetric"}  Neuro:Alert and oriented x3. Gait normal. Reflexes and motor strength normal and symmetric. Cranial nerves 2-12 and sensation grossly intact.  Psychnormal affect  Tel AV pacing  Underlying rhythm:complete heart block with ventricular escape at 40  Basic Metabolic Panel:   Lab  01/07/12 0430  01/06/12 0350  01/05/12 1702  01/05/12 1700  01/05/12 0424  01/04/12 2004  01/04/12 2000  01/04/12 1439  01/04/12 1250  01/01/12 1048   NA  136  135  141  --  139  142  --  142  141  --   K  3.5  3.9  4.3  --  4.8  4.7  --  3.7  3.8  --   CL  98  100  105  --  104  108  --  --  --  101   CO2  25  25  --  --  22  --  --  --  --  21   GLUCOSE  83  151*  176*  --  201*  147*  --  82  186*  --   BUN  29*  22  20  --  17  21  --  --  --  22   CREATININE  1.02  0.96  1.10  0.93  0.80  0.90  0.84  --  --  --   CALCIUM  9.2  8.8  --  --  --  --  --  --  --  --   MG  --  --  --  2.3  2.5  --  --  --  --  --   PHOS  --  --  --  --  --  --  --  --  --  --  Cardiac Enzymes:  No results found for this basename:  CKTOTAL:3,CKMB:3,CKMBINDEX:3,TROPONINI:3 in the last 72 hours  CBC:   Lab  01/07/12 0430  01/06/12 0350  01/05/12 1702  01/05/12 1700  01/05/12 0424  01/04/12 2004  01/04/12 2000  01/04/12 1430  01/04/12 1126  01/01/12 1048   WBC  17.2*  19.2*  --  25.2*  23.0*  --  25.0*  15.6*  --  10.8*   NEUTROABS  --  --  --  --  --  --  --  --  --  --   HGB  8.8*  8.8*  9.9*  9.6*  9.7*  10.2*  10.0*  --  --  --   HCT  26.2*  26.7*  29.0*  30.0*  29.2*  30.0*  29.5*  --  --  --   MCV  95.3  96.4  --  97.4  94.8  --  93.7  94.7  --  95.6   PLT  116*  100*  --  133*  169  --  148*  128*  119*  --    Radiology/Studies: Dg Chest 2 View  01/01/2012 *RADIOLOGY REPORT* Clinical Data: Pre mitral valve replacement CHEST - 2 VIEW Comparison: None. Findings: Lungs are clear. No pleural effusion or pneumothorax. The heart is top normal in size/mildly enlarged. Mild degenerative changes of the visualized thoracolumbar spine. IMPRESSION: No evidence of acute cardiopulmonary disease. Original Report Authenticated By: Charline Bills, M.D.  Dg Chest Portable 1 View  01/07/2012 *RADIOLOGY REPORT* Clinical Data: Chest tubes in place. Mitral valve repair. Tricuspid valve replacement. Congestive heart failure. PORTABLE CHEST - 1 VIEW Comparison: 01/06/2012 Findings: Removal of right IJ Cordis sheath. Bilateral chest tubes remain in place. The right apical pneumothorax is decreased and may have resolved. Cannot exclude less than 5% residua. No left- sided pneumothorax identified. Mild cardiomegaly. Mild right hemidiaphragm elevation. No pleural fluid. Interstitial edema is moderate and similar. Left greater than right bibasilar airspace disease is not significantly changed. IMPRESSION: 1. Chest tubes in place. Improved versus resolved right apical pneumothorax. No left-sided pneumothorax. 2. Otherwise similar congestive heart failure with bibasilar atelectasis. Original Report Authenticated By: Consuello Bossier, M.D.  Dg Chest Portable  1 View In Am  01/06/2012 *RADIOLOGY REPORT* Clinical Data: Status post CABG. Chest tubes. PORTABLE CHEST - 1 VIEW Comparison: Chest 01/05/2012. Findings: Swan-Ganz catheter has been removed with an IJ sheath still in place. Bilateral chest tubes remain in place. Mediastinal drain has been removed. The patient has a tiny right apical pneumothorax, slightly larger than on yesterday's exam. No left pneumothorax is identified. Small bilateral pleural effusions and basilar atelectasis appear worse on the left. IMPRESSION: 1. Support apparatus as described. Tiny right apical pneumothorax is slightly larger than on the study yesterday. 2. Basilar atelectasis and small effusions, greater on the left. Original Report Authenticated By: Bernadene Bell. D'ALESSIO, M.D.  Dg Chest Portable 1 View In Am  01/05/2012 *RADIOLOGY REPORT* Clinical Data: Postop CABG PORTABLE CHEST - 1 VIEW Comparison: 01/04/2012 Findings: Bilateral chest tubes, mediastinal drains and a right internal jugular Swan-Ganz catheter are in place. The Swan-Ganz catheter has been pulled back and the tip is now located in the region of the pulmonary outflow tract. The patient is status post median sternotomy. Heart size and mediastinal contours are stable. The lung fields demonstrate mild pulmonary vascular congestion with no evidence for overt congestive failure. No focal infiltrates or pleural effusions are seen. A trace right apical pneumothorax is noted. No  definite pleural air is seen on the left. IMPRESSION: Improved Swan-Ganz catheter position. Trace right apical pneumothorax. Otherwise stable postoperative cardiopulmonary appearance Original Report Authenticated By: Bertha Stakes, M.D.  Dg Chest Portable 1 View  01/04/2012 *RADIOLOGY REPORT* Clinical Data: Postop PORTABLE CHEST - 1 VIEW Comparison: Portable exam 1455 hours compared to 01/01/2012 Findings: Tip of endotracheal tube approximately 2.3 cm above carina. Nasogastric tube coiled in stomach.  Mediastinal drain and bilateral thoracostomy tubes noted. Right jugular Swan-Ganz catheter, tip peripherally positioned in right lower lobe pulmonary artery. Epicardial pacing wires noted. Left atrial appendage occlusion clip present. Minimal enlargement of cardiac silhouette post interval median sternotomy and bivalvular replacement. Right basilar atelectasis. No pneumothorax. IMPRESSION: Postoperative changes from cardiac valvular surgery as above. Tip of the right jugular Swan-Ganz catheter appears fairly far peripherally in the right lower lobe pulmonary artery, recommend repositioning. I called these results to SICU and was informed by the patient's nurse that this catheter has already been repositioned. Original Report Authenticated By: Lollie Marrow, M.D.  Patient Active Hospital Problem List:   Atrioventricular block, complete (01/07/2012) Nonischemic cardiomyopathy (01/07/2012) S/P tricuspid valve repair (01/04/2012) S/P Maze operation for atrial fibrillation (01/04/2012)  S/P mitral valve repair (01/04/2012) Pt with valve repair of both AV valves and now with post op CHB She is not likely to recover AV conduction that is stable but will reassess in am Otherwise would proceed with pacemaker implantation. She had LV leads placed at the time of surgery so I would anticipate [place] using them .  The issue of an ICD needs also be considered with LV dysfunction. This is one of the [employer] acceptable alternative buckets  Dr Leonia Reeves can address with family in am as they are not Here this afternoon  Orders written   EP Addendum Patient seen and examined. With EF 45% preop and 50% immediately post op, and the potential for improvement in LV function with BiV pacing, and no h/o malignant ventricular arrhythmias, I have recommended proceeding with BiV PPM. The risks/benefits/goals/expectations of the procedure have been discussed with the patient and she wishes to proceed. Lewayne Bunting, M.D.

## 2012-01-08 NOTE — Op Note (Signed)
DDD (BiV PPM implanted) via left subclavian vein. W#[id number].

## 2012-01-08 NOTE — Progress Notes (Signed)
   CARE MANAGEMENT NOTE 01/08/2012  Patient:  Tara Silva, Tara Silva   Account Number:  [id number]  Date Initiated:  01/08/2012  Documentation initiated by:  [facility]  Subjective/Objective Assessment:   Admitted post MVR,TCVR and Maze on 01-04-12.  Has family.     Action/Plan:   PTA, PT INDEPENDENT, LIVES WITH SON; STATES DAUGHTER IN LAW WILL STAY WITH HER DURING THE DAY, SON AT NIGHT.  WILL FOLLOW FOR HOME NEEDS AS PT PROGRESSES.   Anticipated DC Date:  01/15/2012   Anticipated DC Plan:  HOME W HOME HEALTH SERVICES      DC Planning Services  CM consult      Choice offered to / List presented to:             Status of service:  In process, will continue to follow Medicare Important Message given?   (If response is "NO", the following Medicare IM given date fields will be blank) Date Medicare IM given:   Date Additional Medicare IM given:    Discharge Disposition:    Per UR Regulation:  Reviewed for med. necessity/level of care/duration of stay  If discussed at Long Length of Stay Meetings, dates discussed:    Comments:    Phone #[phone]

## 2012-01-08 NOTE — Progress Notes (Signed)
   CARDIOTHORACIC SURGERY PROGRESS NOTE   R4 Days Post-Op Procedure(s) (LRB): MITRAL VALVE REPAIR (MVR) (N/A) TRICUSPID VALVE REPAIR (N/A) MAZE (N/A)  Subjective: Looks good.  No complaints.  Objective: Vital signs: BP Readings from Last 1 Encounters:  01/08/12 132/76   Pulse Readings from Last 1 Encounters:  01/08/12 79   Resp Readings from Last 1 Encounters:  01/08/12 20   Temp Readings from Last 1 Encounters:  01/08/12 97.6 F (36.4 C) Oral    Hemodynamics:    Physical Exam:  Rhythm:   AV paced  Breath sounds: clear  Heart sounds:  RRR without murmur  Incisions:  Dressings dry  Abdomen:  soft  Extremities:  warm   Intake/Output from previous day: 03/24 0701 - 03/25 0700 In: 1028 [P.O.:1020; IV Piggyback:8] Out: 2450 [Urine:2250; Chest Tube:200] Intake/Output this shift:    Lab Results:  Basename 01/08/12 0540 01/07/12 0430  WBC 11.6* 17.2*  HGB 8.6* 8.8*  HCT 26.1* 26.2*  PLT 137* 116*   BMET:  Basename 01/08/12 0540 01/07/12 0430  NA 132* 136  K 3.9 3.5  CL 95* 98  CO2 26 25  GLUCOSE 114* 83  BUN 30* 29*  CREATININE 0.94 1.02  CALCIUM 9.1 9.2    CBG (last 3)   Basename 01/08/12 0735 01/07/12 2302 01/07/12 1545  GLUCAP 95 118* 162*   ABG    Component Value Date/Time   PHART 7.398 01/04/2012 2058   HCO3 25.7* 01/04/2012 2058   TCO2 25 01/05/2012 1702   ACIDBASEDEF 2.0 01/04/2012 1434   O2SAT 98.0 01/04/2012 2058   CXR: n/a  Assessment/Plan: S/P Procedure(s) (LRB): MITRAL VALVE REPAIR (MVR) (N/A) TRICUSPID VALVE REPAIR (N/A) MAZE (N/A)  Doing well POD4 Postop AV block Expected post op acute blood loss anemia, mild, stable Expected post op volume excess, mild, diuresing Type II diabetes mellitus, glycemic control excellent   For permanent pacer today  D/C pleural tubes after pacer  Continue diuresis  Start coumadin after pacer  Restart amiodarone after pacer    Tara Silva H 01/08/2012 7:58 AM

## 2012-01-08 NOTE — Op Note (Signed)
NAME:  Tara Silva, Tara Silva                      ACCOUNT NO.:  MEDICAL RECORD NO.:  [id number]  LOCATION:                                 FACILITY:  PHYSICIAN:  Doylene Canning. Ladona Ridgel, MD    DATE OF BIRTH:  Sep 07, 1934  DATE OF PROCEDURE:  01/08/2012 DATE OF DISCHARGE:                              OPERATIVE REPORT   ELECTROPHYSIOLOGY PROCEDURE NOTE  PROCEDURE PERFORMED:  Insertion of a bi-v pacemaker with initial right ventricular and atrial leads connected to a previously implanted epicardial left ventricular lead.  INTRODUCTION:  The patient is a 78 year old woman with a history of left bundle-branch block, who underwent atrial and mitral valve repair/replacement.  The patient developed complete heart block post surgery and is now referred for insertion of a dual-chamber biventricular pacemaker.  Of note, the patient's ejection fraction was 45% preoperatively, and she had an epicardial LV lead placed at the time of her initial of her surgery.  PROCEDURE:  After informed consent was obtained, the patient was taken to the diagnostic EP lab in a fasting state.  After usual preparation and draping, intravenous fentanyl was given for sedation.  A 30 mL of lidocaine was infiltrated into the left infraclavicular region.  A 7-cm incision was carried out over this region and electrocautery was utilized to dissect down into the fascia plane.  The left subclavian vein was punctured x2, and the Medtronic, model 5076, 52-cm active fixation pacing lead, serial number [id number], was advanced into the right ventricle and a Medtronic, model 5076, 45-cm active fixation pacing lead, serial number [id number], was advanced into the right atrium.  Mapping was carried out in the right ventricle and at the final site on the RV apical septum, the R-waves measured 8 mV, the pacing impedance was 700 ohms, and the threshold was around 1 volt at 0.5 msec. There was a prominent injury current with active fixation of the  lead. With the ventricular lead in satisfactory position, attention was then turned to placement of the atrial lead, which was placed in anterolateral portion of the right atrium where P-waves measured 2 mV. The pacing threshold was less than 1 V at 0.5 msec, and the pacing impedance was 450 ohms.  Again, there was a prominent injury current with active fixation of the lead.  A 10 V pacing in the right atrium and the right ventricle did not stimulate the diaphragm.  At this point, the leads were secured to the subpectoral fascia with a figure-of-eight silk suture and the sewing sleeve was secured with silk suture.  Next, the subcutaneous pocket was expanded and electrocautery and blunt dissection were utilized to enter the previous pocket which held the LV leads.  Attention was made not to go outside the skin or go to through the previous incision.  The two LV leads were pulled from the old pocket into the new pocket.  The pocket was irrigated with antibiotic irrigation.  The LV epicardial unipolar lead was connected to the bi-v pacemaker.  The threshold was 1.3 V at 0.5 msec.  The pacing impedance was 788 ohms.  The 2nd LV lead was capped.  The pocket was  irrigated with additional antibiotic irrigation and the incision was closed with 2- 0 and 3-0 Vicryl.  Benzoin, Steri-Strips were painted on the skin, pressure dressing was applied, and the patient was returned to her room in satisfactory condition.  Of note, her pacing rate was set at 70 beats per minute, though she was tracking at a rate of approximately 85 beats per minute. She had her back up epicardial temporary pacing wires and device were programmed at VVI at 40 beats per minute.     Doylene Canning. Ladona Ridgel, MD     GWT/MEDQ  D:  01/08/2012  T:  01/08/2012  Job:  [id number]  cc:   Salvatore Decent. Cornelius Moras, M.D. Dewain Penning, MD Dr. Hansel Feinstein

## 2012-01-09 ENCOUNTER — Inpatient Hospital Stay (HOSPITAL_COMMUNITY): Payer: Medicare Other

## 2012-01-09 LAB — CBC
HCT: 23.6 % — ABNORMAL LOW (ref 36.0–46.0)
Hemoglobin: 7.9 g/dL — ABNORMAL LOW (ref 12.0–15.0)
MCHC: 33.5 g/dL (ref 30.0–36.0)
MCV: 93.7 fL (ref 78.0–100.0)
RDW: 13 % (ref 11.5–15.5)
WBC: 9 10*3/uL (ref 4.0–10.5)

## 2012-01-09 LAB — BASIC METABOLIC PANEL
BUN: 24 mg/dL — ABNORMAL HIGH (ref 6–23)
Chloride: 96 mEq/L (ref 96–112)
Creatinine, Ser: 0.8 mg/dL (ref 0.50–1.10)
Glucose, Bld: 105 mg/dL — ABNORMAL HIGH (ref 70–99)
Potassium: 3.7 mEq/L (ref 3.5–5.1)

## 2012-01-09 LAB — GLUCOSE, CAPILLARY: Glucose-Capillary: 154 mg/dL — ABNORMAL HIGH (ref 70–99)

## 2012-01-09 MED ORDER — GEMFIBROZIL 600 MG PO TABS
600.0000 mg | ORAL_TABLET | Freq: Two times a day (BID) | ORAL | Status: DC
  Administered 2012-01-09 – 2012-01-12 (×6): 600 mg via ORAL
  Filled 2012-01-09 (×9): qty 1

## 2012-01-09 MED ORDER — INSULIN ASPART 100 UNIT/ML ~~LOC~~ SOLN
0.0000 [IU] | Freq: Three times a day (TID) | SUBCUTANEOUS | Status: DC
  Administered 2012-01-09 (×2): 2 [IU] via SUBCUTANEOUS
  Administered 2012-01-09: 4 [IU] via SUBCUTANEOUS
  Administered 2012-01-10 (×2): 2 [IU] via SUBCUTANEOUS
  Administered 2012-01-10: 4 [IU] via SUBCUTANEOUS
  Administered 2012-01-10 – 2012-01-12 (×4): 2 [IU] via SUBCUTANEOUS

## 2012-01-09 MED ORDER — CARVEDILOL 3.125 MG PO TABS: 3.1250 mg | ORAL_TABLET | Freq: Two times a day (BID) | ORAL | Status: DC

## 2012-01-09 MED ORDER — POTASSIUM CHLORIDE CRYS ER 20 MEQ PO TBCR
40.0000 meq | EXTENDED_RELEASE_TABLET | Freq: Once | ORAL | Status: AC
  Administered 2012-01-09: 40 meq via ORAL

## 2012-01-09 MED ORDER — FERROUS SULFATE 325 (65 FE) MG PO TABS
325.0000 mg | ORAL_TABLET | Freq: Every day | ORAL | Status: DC
Start: 2012-01-10 — End: 2012-01-12
  Administered 2012-01-10 – 2012-01-12 (×3): 325 mg via ORAL
  Filled 2012-01-09 (×4): qty 1

## 2012-01-09 MED ORDER — POTASSIUM CHLORIDE CRYS ER 20 MEQ PO TBCR
20.0000 meq | EXTENDED_RELEASE_TABLET | Freq: Two times a day (BID) | ORAL | Status: DC
  Filled 2012-01-09: qty 2
  Filled 2012-01-09 (×2): qty 1

## 2012-01-09 MED ORDER — SODIUM CHLORIDE 0.9 % IV SOLN: 250.0000 mL | INTRAVENOUS | Status: DC | PRN

## 2012-01-09 MED ORDER — ATORVASTATIN CALCIUM 40 MG PO TABS
40.0000 mg | ORAL_TABLET | Freq: Every day | ORAL | Status: DC
  Administered 2012-01-09 – 2012-01-11 (×3): 40 mg via ORAL
  Filled 2012-01-09 (×5): qty 1

## 2012-01-09 MED ORDER — POTASSIUM CHLORIDE 10 MEQ/50ML IV SOLN: 10.0000 meq | INTRAVENOUS | Status: DC

## 2012-01-09 MED ORDER — LOSARTAN POTASSIUM 50 MG PO TABS
50.0000 mg | ORAL_TABLET | Freq: Every day | ORAL | Status: DC
  Administered 2012-01-09 – 2012-01-12 (×4): 50 mg via ORAL
  Filled 2012-01-09 (×4): qty 1

## 2012-01-09 MED ORDER — MOVING RIGHT ALONG BOOK
Freq: Once | Status: AC
  Administered 2012-01-09: 09:00:00
  Filled 2012-01-09: qty 1

## 2012-01-09 MED ORDER — COUMADIN BOOK
Freq: Once | Status: AC
  Administered 2012-01-09: 10:00:00
  Filled 2012-01-09: qty 1

## 2012-01-09 MED ORDER — METOPROLOL SUCCINATE ER 25 MG PO TB24
25.0000 mg | ORAL_TABLET | Freq: Every day | ORAL | Status: DC
  Administered 2012-01-09 – 2012-01-12 (×4): 25 mg via ORAL
  Filled 2012-01-09 (×4): qty 1

## 2012-01-09 MED ORDER — WARFARIN VIDEO
Freq: Once | Status: AC
  Administered 2012-01-09: 10:00:00

## 2012-01-09 MED ORDER — FUROSEMIDE 40 MG PO TABS
40.0000 mg | ORAL_TABLET | Freq: Two times a day (BID) | ORAL | Status: DC
  Administered 2012-01-09 – 2012-01-12 (×7): 40 mg via ORAL
  Filled 2012-01-09 (×10): qty 1

## 2012-01-09 MED ORDER — SODIUM CHLORIDE 0.9 % IJ SOLN: 3.0000 mL | INTRAMUSCULAR | Status: DC | PRN

## 2012-01-09 MED ORDER — SODIUM CHLORIDE 0.9 % IJ SOLN
3.0000 mL | Freq: Two times a day (BID) | INTRAMUSCULAR | Status: DC
  Administered 2012-01-09 – 2012-01-12 (×7): 3 mL via INTRAVENOUS

## 2012-01-09 NOTE — Progress Notes (Addendum)
   CARDIOTHORACIC SURGERY PROGRESS NOTE  R5 Days Post-Op Procedure(s) (LRB):  MITRAL VALVE REPAIR (MVR) (N/A)  TRICUSPID VALVE REPAIR (N/A)  MAZE (N/A)   R1 Day Post-Op Procedure(s) (LRB): PERMANENT PACEMAKER INSERTION (N/A)  Subjective: Looks great.  Mild soreness. No shortness of breath.  Objective: Vital signs: BP Readings from Last 1 Encounters:  01/09/12 143/84   Pulse Readings from Last 1 Encounters:  01/09/12 79   Resp Readings from Last 1 Encounters:  01/09/12 19   Temp Readings from Last 1 Encounters:  01/09/12 97.2 F (36.2 C) Axillary    Hemodynamics:    Physical Exam:  Rhythm:   AV paced, narrow complex  Breath sounds: clear  Heart sounds:  RRR without murmur  Incisions:  Dressings dry and intact  Abdomen:  soft  Extremities:  Warm, well perfused   Intake/Output from previous day: 03/25 0701 - 03/26 0700 In: 830 [P.O.:480; I.V.:200; IV Piggyback:150] Out: 1435 [Urine:1275; Chest Tube:160] Intake/Output this shift: Total I/O In: -  Out: 500 [Urine:500]  Lab Results:  [facility] 01/09/12 0417 01/08/12 0540  WBC 9.0 11.6*  HGB 7.9* 8.6*  HCT 23.6* 26.1*  PLT 155 137*   BMET:  Basename 01/09/12 0417 01/08/12 0540  NA 133* 132*  K 3.7 3.9  CL 96 95*  CO2 27 26  GLUCOSE 105* 114*  BUN 24* 30*  CREATININE 0.80 0.94  CALCIUM 8.8 9.1    CBG (last 3)   Basename 01/09/12 0746 01/08/12 2302 01/08/12 1726  GLUCAP 107* 131* 149*   ABG    Component Value Date/Time   PHART 7.398 01/04/2012 2058   HCO3 25.7* 01/04/2012 2058   TCO2 25 01/05/2012 1702   ACIDBASEDEF 2.0 01/04/2012 1434   O2SAT 98.0 01/04/2012 2058   CXR: Clear, pacer leads in good position  Assessment/Plan: S/P Procedure(s) (LRB):  MITRAL VALVE REPAIR (MVR) (N/A)  TRICUSPID VALVE REPAIR (N/A)  MAZE (N/A)  S/P Procedure(s) (LRB): PERMANENT PACEMAKER INSERTION (N/A)  Doing very well Expected post op acute blood loss anemia, somewhat worse since pacer yesterday Chronic  anemia preop Expected post op volume excess, mild, diuresing Type II diabetes mellitus, glycemic control excellent   Transfuse 1 unit PRBC's  Mobilize  Diuresis  D/C temporary pacing wires  Coumadin  Amiodarone  Restart low dose beta blocker and ARB   Deneka Greenwalt H 01/09/2012 8:29 AM

## 2012-01-09 NOTE — Progress Notes (Signed)
Chaplain made initial visit with patient. Chaplain listened and offered emotional support. Chaplain also prayed with patient. Chaplain will follow up as needed.

## 2012-01-09 NOTE — Progress Notes (Signed)
   ELECTROPHYSIOLOGY ROUNDING NOTE    Patient Name: Tara Silva Date of Encounter: 01-09-2012    SUBJECTIVE:Patient status post CRT-P 01-08-2012 for CHB post MVR.  Minimal soreness, breathing ok.  No chest pain.   TELEMETRY: Reviewed telemetry pt in sinus with CRT pacing. Filed Vitals:   01/09/12 0400 01/09/12 0500 01/09/12 0600 01/09/12 0700  BP: 138/77 134/72 144/77 143/84  Pulse: 76 72 74 79  Temp:      TempSrc:      Resp: 23 23 12 19   Height:      Weight:   149 lb 4 oz (67.7 kg)   SpO2: 100% 100% 100% 96%    Intake/Output Summary (Last 24 hours) at 01/09/12 0743 Last data filed at 01/09/12 0459  Gross per 24 hour  Intake    830 ml  Output   1435 ml  Net   -605 ml    LABS: Basic Metabolic Panel:  Basename 01/09/12 0417 01/08/12 0540  NA 133* 132*  K 3.7 3.9  CL 96 95*  CO2 27 26  GLUCOSE 105* 114*  BUN 24* 30*  CREATININE 0.80 0.94  CALCIUM 8.8 9.1  MG -- --  PHOS -- --   CBC:  Basename 01/09/12 0417 01/08/12 0540  WBC 9.0 11.6*  NEUTROABS -- --  HGB 7.9* 8.6*  HCT 23.6* 26.1*  MCV 93.7 95.6  PLT 155 137*    Radiology/Studies:   Final result pending, leads in stable position.  PHYSICAL EXAM Left chest without hematoma.  Tegaderm left in place.   DEVICE INTERROGATION: Device interrogated by industry.  Lead values including impedence, sensing, threshold within normal values.    A/P 1. S/p BiV PPM 2. CHB 3. S/P MV/TV repair/replacement. Rec: I would like to see back in the office once in 2-3 months. Then followup with Cardiology in [facility]

## 2012-01-10 ENCOUNTER — Encounter (HOSPITAL_COMMUNITY): Payer: Self-pay | Admitting: *Deleted

## 2012-01-10 LAB — CBC
MCH: 31.9 pg (ref 26.0–34.0)
MCHC: 34.1 g/dL (ref 30.0–36.0)
Platelets: 177 10*3/uL (ref 150–400)
RBC: 3.1 MIL/uL — ABNORMAL LOW (ref 3.87–5.11)
RDW: 13.8 % (ref 11.5–15.5)

## 2012-01-10 LAB — GLUCOSE, CAPILLARY
Glucose-Capillary: 123 mg/dL — ABNORMAL HIGH (ref 70–99)
Glucose-Capillary: 141 mg/dL — ABNORMAL HIGH (ref 70–99)
Glucose-Capillary: 145 mg/dL — ABNORMAL HIGH (ref 70–99)
Glucose-Capillary: 167 mg/dL — ABNORMAL HIGH (ref 70–99)

## 2012-01-10 LAB — BASIC METABOLIC PANEL
CO2: 31 mEq/L (ref 19–32)
Calcium: 8.9 mg/dL (ref 8.4–10.5)
Creatinine, Ser: 0.84 mg/dL (ref 0.50–1.10)
GFR calc non Af Amer: 66 mL/min — ABNORMAL LOW (ref >90)
Glucose, Bld: 130 mg/dL — ABNORMAL HIGH (ref 70–99)
Sodium: 132 mEq/L — ABNORMAL LOW (ref 135–145)

## 2012-01-10 LAB — PROTIME-INR: Prothrombin Time: 17.1 seconds — ABNORMAL HIGH (ref 11.6–15.2)

## 2012-01-10 MED ORDER — POTASSIUM CHLORIDE CRYS ER 20 MEQ PO TBCR
30.0000 meq | EXTENDED_RELEASE_TABLET | Freq: Two times a day (BID) | ORAL | Status: DC
  Administered 2012-01-10 – 2012-01-12 (×5): 30 meq via ORAL
  Filled 2012-01-10 (×6): qty 1

## 2012-01-10 NOTE — Progress Notes (Signed)
Ambulates 250 ft using rolling walker with O2 at 2LPM,tolerated well.

## 2012-01-10 NOTE — Progress Notes (Signed)
Patient ambulated 58ft with rolling walker on room air without difficulty. Tired at end of walk but looking forward to one more walk before end of day.  Will continue to monitor. Tara Silva

## 2012-01-10 NOTE — Plan of Care (Signed)
Problem: Phase III Progression Outcomes Goal: Time patient transferred to PCTU/Telemetry POD Outcome: Completed/Met Date Met:  01/10/12 1744 PM

## 2012-01-10 NOTE — Progress Notes (Addendum)
[address]            Tara Silva [address]          [phone]     2 Days Post-Op  Procedure(s) (LRB): PERMANENT PACEMAKER INSERTION (N/A) Subjective: Feels ok, had short episode where she felt SOB for about 30 minutes last night , improved with O2  Objective  Telemetry paced rhythm  Temp:  [97.4 F (36.3 C)-98.9 F (37.2 C)] 97.7 F (36.5 C) (03/27 0610) Pulse Rate:  [70-82] 77  (03/27 0610) Resp:  [13-23] 20  (03/27 0610) BP: (115-143)/(66-102) 136/87 mmHg (03/27 0610) SpO2:  [97 %-100 %] 98 % (03/27 0610) Weight:  [146 lb 13.2 oz (66.6 kg)] 146 lb 13.2 oz (66.6 kg) (03/27 0500) preop wt 69 kg  Intake/Output Summary (Last 24 hours) at 01/10/12 0816 Last data filed at 01/10/12 0140  Gross per 24 hour  Intake    830 ml  Output   1452 ml  Net   -622 ml       General appearance: alert, cooperative and no distress Heart: regular rate and rhythm and S1, S2 normal Lungs: clear to auscultation bilaterally Abdomen: benign exam Extremities: minimal edema Wound: incisions healing well  Lab Results:  Basename 01/10/12 0530 01/09/12 0417  NA 132* 133*  K 3.4* 3.7  CL 94* 96  CO2 31 27  GLUCOSE 130* 105*  BUN 19 24*  CREATININE 0.84 0.80  CALCIUM 8.9 8.8  MG -- --  PHOS -- --   No results found for this basename: AST:2,ALT:2,ALKPHOS:2,BILITOT:2,PROT:2,ALBUMIN:2 in the last 72 hours No results found for this basename: LIPASE:2,AMYLASE:2 in the last 72 hours  Basename 01/10/12 0530 01/09/12 0417  WBC 8.8 9.0  NEUTROABS -- --  HGB 9.9* 7.9*  HCT 29.0* 23.6*  MCV 93.5 93.7  PLT 177 155   No results found for this basename: CKTOTAL:4,CKMB:4,TROPONINI:4 in the last 72 hours No components found with this basename: POCBNP:3 No results found for this basename: DDIMER in the last 72 hours No results found for this basename: HGBA1C in the last 72 hours No results found for this basename: CHOL,HDL,LDLCALC,TRIG,CHOLHDL in the last 72  hours No results found for this basename: TSH,T4TOTAL,FREET3,T3FREE,THYROIDAB in the last 72 hours No results found for this basename: VITAMINB12,FOLATE,FERRITIN,TIBC,IRON,RETICCTPCT in the last 72 hours  Medications: Scheduled    . acetaminophen  1,000 mg Oral Q6H   Or  . acetaminophen (TYLENOL) oral liquid 160 mg/5 mL  975 mg Per Tube Q6H  . amiodarone  200 mg Oral BID PC  . atorvastatin  40 mg Oral QHS  . bisacodyl  10 mg Oral Daily   Or  . bisacodyl  10 mg Rectal Daily  . coumadin book   Does not apply Once  . docusate sodium  200 mg Oral Daily  . enoxaparin (LOVENOX) injection  30 mg Subcutaneous Q24H  . ferrous sulfate  325 mg Oral Q breakfast  . furosemide  40 mg Oral BID  . gemfibrozil  600 mg Oral BID AC  . insulin aspart  0-24 Units Subcutaneous TID AC & HS  . insulin glargine  20 Units Subcutaneous q morning - 10a  . losartan  50 mg Oral Daily  . metoprolol succinate  25 mg Oral Daily  . moving right along book   Does not apply Once  . pantoprazole  40 mg Oral Q1200  .  potassium chloride  20 mEq Oral BID  . potassium chloride  40 mEq Oral Once  . sodium chloride  3 mL Intravenous Q12H  . warfarin  2 mg Oral q1800  . warfarin   Does not apply Once  . Warfarin - Physician Dosing Inpatient   Does not apply q1800  . DISCONTD: carvedilol  3.125 mg Oral BID WC  . DISCONTD: insulin aspart  0-24 Units Subcutaneous TID AC & HS  . DISCONTD: potassium chloride  10 mEq Intravenous Q1 Hr x 4  . DISCONTD: sodium chloride  3 mL Intravenous Q12H     Radiology/Studies:  Dg Chest Portable 1 View  01/09/2012  *RADIOLOGY REPORT*  Clinical Data: Follow-up pacemaker insertion yesterday.  PORTABLE CHEST - 1 VIEW  Comparison: 01/07/2012, multiple priors dating back to 01/01/2012.  Findings: Left subclavian dual lead pacer is present. The distal leads project over the expected locations of the right atrium and right ventricle (exact location cannot be definitely confirmed on a single-view  radiograph) There are changes of median sternotomy for mitral and tricuspid valvular replacement. Stable cardiomegaly. There is pulmonary vascular congestion and interstitial pulmonary edema.  Bibasilar atelectasis, right greater than left, is similar to slightly increased compared to yesterday's radiograph.  A small left pleural effusion is visible.  Both chest tubes have been removed.  A skin fold projecting over the right axilla and right scapula continues over the right lung apex. No definite pneumothorax is visualized on either side.  IMPRESSION:  1. Left subclavian dual lead pacer with leads projecting over the expected location of the right atrium right ventricle. 2.  Status post removal of bilateral chest tubes.  No definite visible pneumothorax.  A right-sided skin fold projects over the right axillary region and the right lung apex.  Two-view chest radiograph could be performed for further evaluation of the lung apices, if desired. 3.  Mild congestive heart failure pattern with bibasilar atelectasis.  Original Report Authenticated By: Britta Mccreedy, M.D.    INR: Will add last result for INR, ABG once components are confirmed Will add last 4 CBG results once components are confirmed  Assessment/Plan: S/P Procedure(s) (LRB): PERMANENT PACEMAKER INSERTION (N/A)  1. Continues to do well 2. Cont mobilization, may benefit from home PT 3.continue diuresis, can probably reduce lasix soon, replace K+ further 4. Wean O2 off, pulm toilet 5. Anemia improved with transfusion 6. Rhythm management as per EP  LOS: 6 days    GOLD,WAYNE E 3/27/20138:16 AM    I have seen and examined the patient and agree with the assessment and plan as outlined.  Elford Evilsizer H 01/10/2012 2:35 PM

## 2012-01-10 NOTE — Plan of Care (Signed)
Problem: Phase III Progression Outcomes Goal: Transfer to PCTU/Telemetry POD Outcome: Completed/Met Date Met:  01/10/12 5 days post op.

## 2012-01-10 NOTE — Progress Notes (Signed)
CARDIAC REHAB PHASE I   PRE:  Rate/Rhythm: Paced 75  BP:  Supine:   Sitting: 120/70  Standing:    SaO2: 97%RA  MODE:  Ambulation: 550 ft   POST:  Rate/Rhythem: 87Paced  BP:  Supine  Sitting:140/80   Standing:    SaO2: 98%RA  0950-1026 Pt walked 550 ft on RA with rolling walker and asst x 1. Gait slow and steady. Tolerated on RA well. To recliner after walk. Call bell in reach. Stated she had rolling walker that was her husband's for home use. Duanne Limerick

## 2012-01-10 NOTE — Progress Notes (Signed)
Patient oxygen saturation on room air 92-95%.  Encouraged continued usage of IS and cough/deep breathing. Will continue to monitor oxygen needs. Tara Silva

## 2012-01-11 LAB — GLUCOSE, CAPILLARY

## 2012-01-11 LAB — PROTIME-INR: Prothrombin Time: 18.8 seconds — ABNORMAL HIGH (ref 11.6–15.2)

## 2012-01-11 MED ORDER — TRAMADOL HCL 50 MG PO TABS: 50.0000 mg | ORAL_TABLET | Freq: Four times a day (QID) | ORAL | Status: AC | PRN

## 2012-01-11 MED ORDER — AMIODARONE HCL 200 MG PO TABS: 200.0000 mg | ORAL_TABLET | Freq: Two times a day (BID) | ORAL | Status: DC

## 2012-01-11 MED ORDER — WARFARIN SODIUM 2 MG PO TABS: 2.0000 mg | ORAL_TABLET | Freq: Every day | ORAL | Status: DC

## 2012-01-11 MED ORDER — LINAGLIPTIN 5 MG PO TABS
5.0000 mg | ORAL_TABLET | Freq: Every day | ORAL | Status: DC
  Administered 2012-01-11 – 2012-01-12 (×2): 5 mg via ORAL
  Filled 2012-01-11 (×2): qty 1

## 2012-01-11 NOTE — Progress Notes (Signed)
   CARE MANAGEMENT NOTE 01/11/2012  Patient:  Tara Silva, Tara Silva   Account Number:  [id number]  Date Initiated:  01/08/2012  Documentation initiated by:  [facility]  Subjective/Objective Assessment:   Admitted post MVR,TCVR and Maze on 01-04-12.  Has family.     Action/Plan:   PTA, PT INDEPENDENT, LIVES WITH SON; STATES DAUGHTER IN LAW WILL STAY WITH HER DURING THE DAY, SON AT NIGHT.  WILL FOLLOW FOR HOME NEEDS AS PT PROGRESSES.   Anticipated DC Date:  01/15/2012   Anticipated DC Plan:  HOME W HOME HEALTH SERVICES      DC Planning Services  CM consult      [facility]  HOME HEALTH   Choice offered to / List presented to:  C-1 Patient        HH arranged  HH-1 RN  HH-2 PT      [facility] agency  [facility]   Status of service:  In process, will continue to follow Medicare Important Message given?   (If response is "NO", the following Medicare IM given date fields will be blank) Date Medicare IM given:   Date Additional Medicare IM given:    Discharge Disposition:  HOME W HOME HEALTH SERVICES  Per UR Regulation:  Reviewed for med. necessity/level of care/duration of stay  If discussed at Long Length of Stay Meetings, dates discussed:    Comments:  01/10/12 Larri Brewton,RN,BSN 1530 MET WITH PT TO DISCUSS HOME NEEDS. CM REFERRAL FOR HOME HEALTH FOLLOW UP.  REFERRAL TO [facility] PER CHOICE FOR HHRN AND HHPT, START OF CARE 24-48H POST DC DATE. Phone #[phone]

## 2012-01-11 NOTE — Progress Notes (Signed)
Patient ambulated 650 ft with rolling walker without difficulty. Patient tolerated walk well with one standing rest break. Encouraged one more walk before completion of day. Will continue to monitor. Nickolas Madrid

## 2012-01-11 NOTE — Progress Notes (Addendum)
[address]            [place] [address]          [phone]     3 Days Post-Op  Procedure(s) (LRB): PERMANENT PACEMAKER INSERTION (N/A) Subjective: Feels well  Objective  Telemetry Paced rhythm  Temp:  [97 F (36.1 C)-97.6 F (36.4 C)] 97.6 F (36.4 C) (03/28 0438) Pulse Rate:  [69-73] 69  (03/28 0438) Resp:  [16-20] 20  (03/28 0438) BP: (108-127)/(66-90) 123/66 mmHg (03/28 0438) SpO2:  [93 %-98 %] 93 % (03/28 0438) Weight:  [142 lb 4.8 oz (64.547 kg)] 142 lb 4.8 oz (64.547 kg) (03/28 0438)   Intake/Output Summary (Last 24 hours) at 01/11/12 0834 Last data filed at 01/10/12 2355  Gross per 24 hour  Intake    360 ml  Output   2600 ml  Net  -2240 ml       General appearance: alert, cooperative and no distress Heart: regular rate and rhythm and S1, S2 normal Lungs: clear to auscultation bilaterally Abdomen: soft, non-tender, + BS Extremities: trace edema Wound: incisions healing well  Lab Results:  Basename 01/10/12 0530 01/09/12 0417  NA 132* 133*  K 3.4* 3.7  CL 94* 96  CO2 31 27  GLUCOSE 130* 105*  BUN 19 24*  CREATININE 0.84 0.80  CALCIUM 8.9 8.8  MG -- --  PHOS -- --   No results found for this basename: AST:2,ALT:2,ALKPHOS:2,BILITOT:2,PROT:2,ALBUMIN:2 in the last 72 hours No results found for this basename: LIPASE:2,AMYLASE:2 in the last 72 hours  Basename 01/10/12 0530 01/09/12 0417  WBC 8.8 9.0  NEUTROABS -- --  HGB 9.9* 7.9*  HCT 29.0* 23.6*  MCV 93.5 93.7  PLT 177 155   No results found for this basename: CKTOTAL:4,CKMB:4,TROPONINI:4 in the last 72 hours No components found with this basename: POCBNP:3 No results found for this basename: DDIMER in the last 72 hours No results found for this basename: HGBA1C in the last 72 hours No results found for this basename: CHOL,HDL,LDLCALC,TRIG,CHOLHDL in the last 72 hours No results found for this basename: TSH,T4TOTAL,FREET3,T3FREE,THYROIDAB in the last 72  hours No results found for this basename: VITAMINB12,FOLATE,FERRITIN,TIBC,IRON,RETICCTPCT in the last 72 hours  Medications: Scheduled    . amiodarone  200 mg Oral BID PC  . atorvastatin  40 mg Oral QHS  . bisacodyl  10 mg Oral Daily   Or  . bisacodyl  10 mg Rectal Daily  . docusate sodium  200 mg Oral Daily  . enoxaparin (LOVENOX) injection  30 mg Subcutaneous Q24H  . ferrous sulfate  325 mg Oral Q breakfast  . furosemide  40 mg Oral BID  . gemfibrozil  600 mg Oral BID AC  . insulin aspart  0-24 Units Subcutaneous TID AC & HS  . insulin glargine  20 Units Subcutaneous q morning - 10a  . losartan  50 mg Oral Daily  . metoprolol succinate  25 mg Oral Daily  . pantoprazole  40 mg Oral Q1200  . potassium chloride  30 mEq Oral BID  . sodium chloride  3 mL Intravenous Q12H  . warfarin  2 mg Oral q1800  . Warfarin - Physician Dosing Inpatient   Does not apply q1800     Radiology/Studies:  No results found.  INR:1.54 Will add last result for INR, ABG once components are confirmed Will add last 4 CBG results once components  are confirmed  Assessment/Plan: S/P Procedure(s) (LRB): PERMANENT PACEMAKER INSERTION (N/A)  1. Good progress  2. CBG OK-DC Lantus, restart Onglyza 3. CONT REHAB 4. POSS D/C 1-2 DAYS  LOS: 7 days    GOLD,WAYNE E 3/28/20138:34 AM    I have seen and examined the patient and agree with the assessment and plan as outlined.  Tentatively plan d/c home in am tomorrow.  Check INR at Dr Valli Glance office [facility]) early next week.  Aim low with coumadin dose and plan for coumadin only 2-3 months due to history of occult GI blood loss in the past.   Purcell Nails 01/11/2012 5:28 PM

## 2012-01-11 NOTE — Progress Notes (Signed)
UR Completed.  Tara Silva Jane [phone] 01/11/2012  

## 2012-01-11 NOTE — Progress Notes (Signed)
CARDIAC REHAB PHASE I   PRE:  Rate/Rhythm: 72 Pacing  BP:  Supine: 110/64  Sitting:   Standing:    SaO2: 96 RA  MODE:  Ambulation: 690 ft   POST:  Rate/Rhythem: 82 Pacing  BP:  Supine:   Sitting: to bathroom after walk  Standing:    SaO2: 97 RA [id number] Assisted x 1 to ambulate and used walker. Gait steady with walker. Increased distance. VS stable. To bathroom after walk. Family present in room, pt states her sister will get her out of bathroom. Discussed Outpt. CRP with pt. She agrees to [place] sending [place] a letter of interest for referral.  Tara Silva

## 2012-01-11 NOTE — Discharge Instructions (Signed)
Atrial Fibrillation Your caregiver has diagnosed you with atrial fibrillation (AFib). The heart normally beats very regularly; AFib is a type of irregular heartbeat. The heart rate may be faster or slower than normal. This can prevent your heart from pumping as well as it should. AFib can be constant (chronic) or intermittent (paroxysmal). CAUSES  Atrial fibrillation may be caused by:  Heart disease, including heart attack, coronary artery disease, heart failure, diseases of the heart valves, and others.   Blood clot in the lungs (pulmonary embolism).   Pneumonia or other infections.   Chronic lung disease.   Thyroid disease.   Toxins. These include alcohol, some medications (such as decongestant medications or diet pills), and caffeine.  In some people, no cause for AFib can be found. This is referred to as Lone Atrial Fibrillation. SYMPTOMS   Palpitations or a fluttering in your chest.   A vague sense of chest discomfort.   Shortness of breath.   Sudden onset of lightheadedness or weakness.  Sometimes, the first sign of AFib can be a complication of the condition. This could be a stroke or heart failure. DIAGNOSIS  Your description of your condition may make your caregiver suspicious of atrial fibrillation. Your caregiver will examine your pulse to determine if fibrillation is present. An EKG (electrocardiogram) will confirm the diagnosis. Further testing may help determine what caused you to have atrial fibrillation. This may include chest x-ray, echocardiogram, blood tests, or CT scans. PREVENTION  If you have previously had atrial fibrillation, your caregiver may advise you to avoid substances known to cause the condition (such as stimulant medications, and possibly caffeine or alcohol). You may be advised to use medications to prevent recurrence. Proper treatment of any underlying condition is important to help prevent recurrence. PROGNOSIS  Atrial fibrillation does tend to  become a chronic condition over time. It can cause significant complications (see below). Atrial fibrillation is not usually immediately life-threatening, but it can shorten your life expectancy. This seems to be worse in women. If you have lone atrial fibrillation and are under 60 years old, the risk of complications is very low, and life expectancy is not shortened. RISKS AND COMPLICATIONS  Complications of atrial fibrillation can include stroke, chest pain, and heart failure. Your caregiver will recommend treatments for the atrial fibrillation, as well as for any underlying conditions, to help minimize risk of complications. TREATMENT  Treatment for AFib is divided into several categories:  Treatment of any underlying condition.   Converting you out of AFib into a regular (sinus) rhythm.   Controlling rapid heart rate.   Prevention of blood clots and stroke.  Medications and procedures are available to convert your atrial fibrillation to sinus rhythm. However, recent studies have shown that this may not offer you any advantage, and cardiac experts are continuing research and debate on this topic. More important is controlling your rapid heartbeat. The rapid heartbeat causes more symptoms, and places strain on your heart. Your caregiver will advise you on the use of medications that can control your heart rate. Atrial fibrillation is a strong stroke risk. You can lessen this risk by taking blood thinning medications such as Coumadin (warfarin), or sometimes aspirin. These medications need close monitoring by your caregiver. Over-medication can cause bleeding. Too little medication may not protect against stroke. HOME CARE INSTRUCTIONS   If your caregiver prescribed medicine to make your heartbeat more normally, take as directed.   If blood thinners were prescribed by your caregiver, take EXACTLY as directed.     Perform blood tests EXACTLY as directed.   Quit smoking. Smoking increases your  cardiac and lung (pulmonary) risks.   DO NOT drink alcohol.   DO NOT drink caffeinated drinks (e.g. coffee, soda, chocolate, and leaf teas). You may drink decaffeinated coffee, soda or tea.   If you are overweight, you should choose a reduced calorie diet to lose weight. Please see a registered dietitian if you need more information about healthy weight loss. DO NOT USE DIET PILLS as they may aggravate heart problems.   If you have other heart problems that are causing AFib, you may need to eat a low salt, fat, and cholesterol diet. Your caregiver will tell you if this is necessary.   Exercise every day to improve your physical fitness. Stay active unless advised otherwise.   If your caregiver has given you a follow-up appointment, it is very important to keep that appointment. Not keeping the appointment could result in heart failure or stroke. If there is any problem keeping the appointment, you must call back to this facility for assistance.  SEEK MEDICAL CARE IF:  You notice a change in the rate, rhythm or strength of your heartbeat.   You develop an infection or any other change in your overall health status.  SEEK IMMEDIATE MEDICAL CARE IF:   You develop chest pain, abdominal pain, sweating, weakness or feel sick to your stomach (nausea).   You develop shortness of breath.   You develop swollen feet and ankles.   You develop dizziness, numbness, or weakness of your face or limbs, or any change in vision or speech.  MAKE SURE YOU:   Understand these instructions.   Will watch your condition.   Will get help right away if you are not doing well or get worse.  Document Released: 10/02/2005 Document Revised: 09/21/2011 Document Reviewed: 05/06/2008 [facility] Patient Information 2012 [place], [place].Mitral Valve Replacement/repair Care After  Refer to this sheet in the next few weeks. These instructions provide you with information on caring for yourself after your procedure.  Your caregiver may also give you specific instructions. Your treatment has been planned according to current medical practices, but problems sometimes occur. Call your caregiver if you have any problems or questions after your procedure.  HOME CARE INSTRUCTIONS   Only take over-the-counter or prescription medicines for pain, fever, or discomfort as directed by your caregiver.   Take your temperature every morning for the first week after surgery. Write these down.   Weigh yourself every morning for at least the first week after surgery and record.   Do not lift more than 10 lb (4.5 kg) until your breastbone (sternum) has healed. Avoid all activities which would place strain on your surgical cut (incision).   You may shower. Do not take baths until instructed by your surgeon. Pat incisions dry. Do not rub incisions with washcloth or towel.   Avoid driving for 4 to 6 weeks after surgery or as instructed.   Use your elastic stockings during the day. You should wear the stockings for at least 2 weeks after discharge or longer if your ankles are swollen. The stockings help blood flow and help reduce swelling in the legs. It is easiest to put the stockings on before you get out of bed in the morning. They should be snug.   Some individuals with a mitral valve replacement need to take antibiotics before having dental work or other surgical procedures. This is called prophylactic antibiotic treatment. These drugs help to prevent  infective endocarditis. Antibiotics are only recommended for individuals with the highest risk for developing infective endocarditis. Let your dentist and your caregiver know if you have a history of any of the following so that the necessary precautions can be taken:   A VSD.   A repaired VSD.   Endocarditis in the past.   An artificial (prosthetic) heart valve.  Pain Control  You may feel some chest, shoulder, or abdominal discomfort. It is caused by the absorption of  air through the chest wall.   If a prescription was given for a pain reliever, follow your surgeon's directions.   If the pain is not relieved by your medicine, becomes worse, or you have difficulty breathing, call your surgeon.  Activity  Take frequent rest periods throughout the day.   Wait 1 week before returning to strenuous activities such as heavy lifting (more than 10 pounds), pushing, or pulling.   Talk with your doctor about when you may return to work and your exercise routine.   Do not drive while taking prescription pain medication.  Nutrition  You may resume your normal diet as instructed.   Eat a well-balanced diet.  Elimination Your normal bowel function should return. If constipation should occur, you may:  Take a mild laxative as directed by your caregiver.   Add fruit and bran to your diet.   Drink enough fluids to keep your urine clear or pale yellow.   Call your surgeon if constipation is not relieved.  SEEK IMMEDIATE MEDICAL CARE IF:   You develop chest pain which is not coming from your incision.   You develop shortness of breath or have difficulty breathing.   You develop a temperature over 102 F (38.9 C).   You have a sudden weight gain. Let your doctor know what the weight gain is.   You develop a rash.   You develop any reaction or side effects to medications given.   You have increased bleeding from wounds.   You see redness, swelling, or have increasing pain in wounds.   You have yellowish white fluid (pus) coming from your wound.   You feel lightheaded or feel faint.   You feel sick to your stomach (nauseous) or throw up (vomit).  Document Released: 04/21/2005 Document Revised: 09/21/2011 Document Reviewed: 10/02/2005 [facility] Patient Information 2012 [place], [place].Mitral Valve Replacement Care After  Refer to this sheet in the next few weeks. These instructions provide you with information on caring for yourself after your  procedure. Your caregiver may also give you specific instructions. Your treatment has been planned according to current medical practices, but problems sometimes occur. Call your caregiver if you have any problems or questions after your procedure.  HOME CARE INSTRUCTIONS   Only take over-the-counter or prescription medicines for pain, fever, or discomfort as directed by your caregiver.   Take your temperature every morning for the first week after surgery. Write these down.   Weigh yourself every morning for at least the first week after surgery and record.   Do not lift more than 10 lb (4.5 kg) until your breastbone (sternum) has healed. Avoid all activities which would place strain on your surgical cut (incision).   You may shower. Do not take baths until instructed by your surgeon. Pat incisions dry. Do not rub incisions with washcloth or towel.   Avoid driving for 4 to 6 weeks after surgery or as instructed.   Use your elastic stockings during the day. You should wear the stockings  for at least 2 weeks after discharge or longer if your ankles are swollen. The stockings help blood flow and help reduce swelling in the legs. It is easiest to put the stockings on before you get out of bed in the morning. They should be snug.   Some individuals with a mitral valve replacement need to take antibiotics before having dental work or other surgical procedures. This is called prophylactic antibiotic treatment. These drugs help to prevent infective endocarditis. Antibiotics are only recommended for individuals with the highest risk for developing infective endocarditis. Let your dentist and your caregiver know if you have a history of any of the following so that the necessary precautions can be taken:   A VSD.   A repaired VSD.   Endocarditis in the past.   An artificial (prosthetic) heart valve.  Pain Control  You may feel some chest, shoulder, or abdominal discomfort. It is caused by the  absorption of air through the chest wall.   If a prescription was given for a pain reliever, follow your surgeon's directions.   If the pain is not relieved by your medicine, becomes worse, or you have difficulty breathing, call your surgeon.  Activity  Take frequent rest periods throughout the day.   Wait 1 week before returning to strenuous activities such as heavy lifting (more than 10 pounds), pushing, or pulling.   Talk with your doctor about when you may return to work and your exercise routine.   Do not drive while taking prescription pain medication.  Nutrition  You may resume your normal diet as instructed.   Eat a well-balanced diet.  Elimination Your normal bowel function should return. If constipation should occur, you may:  Take a mild laxative as directed by your caregiver.   Add fruit and bran to your diet.   Drink enough fluids to keep your urine clear or pale yellow.   Call your surgeon if constipation is not relieved.  SEEK IMMEDIATE MEDICAL CARE IF:   You develop chest pain which is not coming from your incision.   You develop shortness of breath or have difficulty breathing.   You develop a temperature over 102 F (38.9 C).   You have a sudden weight gain. Let your doctor know what the weight gain is.   You develop a rash.   You develop any reaction or side effects to medications given.   You have increased bleeding from wounds.   You see redness, swelling, or have increasing pain in wounds.   You have yellowish white fluid (pus) coming from your wound.   You feel lightheaded or feel faint.   You feel sick to your stomach (nauseous) or throw up (vomit).  Document Released: 04/21/2005 Document Revised: 09/21/2011 Document Reviewed: 10/02/2005 [employer] Patient Information 2012 [employer], [place] Im[place] A pacemaker (pacer) is a small device that acts as a backup or takes over for the natural pacemaker of the heart. The heart has its  own electrical system to regulate the beating of the heart muscles. The heart pumps best when it beats in a regular, coordinated rhythm.  A pacer consists of a small device (generator) which produces electrical signals that tell your heart to beat. The generator contains a lithium battery and a tiny computer. Wires (leads) connect the generator to the heart. The pacer is placed under the skin through a small cut. It senses every heartbeat and only fires when the heart rate falls outside certain levels. When the pacer triggers a heart  beat, it is called "capture." PROBLEMS THAT MAY BE HELPED BY A PACEMAKER:  Your heart rate is sometimes too slow or irregular.   Fainting, dizziness, weakness or confusion as a result of low blood flow.   Shortness of breath.   Chest pain or angina if the heart needs more blood and oxygen.   Disturbed sleep as a result of abnormal heart rhythm.   Palpitations or the feeling that the heart is beating too fast, too hard or in an irregular way.   Weak heart muscle pumping ability.  PROCEDURE   The pacer may be placed under the skin near the collarbone, while you are under sedation. An abdominal wall location may be another option.   The leads are inserted into a vein that lies just under the collarbone, then guided into place under x-ray. The tips of the wires touch the inside of the heart. The near end of the pacer wires are connected to the generator under the skin.   For individuals with thinner chest walls, it may be possible to feel the device under the skin, and a slight bump may be seen.  HOME CARE INSTRUCTIONS   Keep the incision dry for a week after the procedure.   For about 8 weeks, avoid sudden or jerky movements that pull your arm away from your body. This could change the position of the leads.   Take medicine exactly as directed.   Learn how to check your pulse. Follow directions about when to call or be concerned.   Be physically active  every day. Ask your caregiver how and when to increase activity.   Household appliances do not interfere with pacemakers.   Travel by car, train or airplane should not be a problem. Carry a pacemaker ID card in case the device sets off a metal detector.  PACEMAKER CARE:  Avoid putting pressure over the area where the pacer was put in.   Digital cell phones should be kept 12 inches away from the pacemaker. Hold them at the ear on the side opposite of the pacer.   Never leave a cell phone in a pocket over the pacemaker.   Avoid strong electro-magnetic fields. You will not be able to have an MRI scan because of the strong magnets.   Pacer batteries last about 5 years and give off warning signals when they are running low on power. Pacers may be checked every 3 months. This allows plenty of time to change the generator when it is running low on power.   Changing the battery means removing the old generator through the same cut and plugging the existing wires into the new generator.   An EKG or heart monitor is used to see if your pacer is working properly. Sometimes signals may be sent over a land line phone to your clinic.   Tell emergency responders that you have a pacemaker.  SEEK MEDICAL CARE IF:  You begin to gain weight and your feet and ankles swell.   You have dizzy spells or feel weak.   Your pulse rate drops below the limit or is too fast.  SEEK IMMEDIATE MEDICAL CARE IF:  You faint or pass out.   You have chest pain or shortness of breath.   You are injured and think your pacemaker may have been damaged.   You are suddenly very tired or have pain in your back.   You are worried that your heart is not beating right or cannot feel your  pulse.  Document Released: 09/22/2002 Document Revised: 09/21/2011 Document Reviewed: 11/29/2007 [facility] Patient Information 2012 [place], [place].Warfarin, Questions and Answers Warfarin (Coumadin) is a prescription medication that  delays normal blood clotting. This is called an "anticoagulant" or "blood thinner." These medications do not actually cause the blood to become less thick, only less likely to clot. Normally, when body tissues are cut or damaged, the blood clots in order to prevent blood loss. Some clots form when they are not supposed to and may travel through the bloodstream and become lodged in smaller blood vessels. Blockage of blood flow can cause serious problems including a stroke (when a blood vessel leading to a portion of the brain is blocked) or a heart attack (when a blood vessel leading to the heart is blocked). WHO SHOULD USE WARFARIN?  Warfarin is prescribed for individuals who have a risk for developing harmful blood clots. People at risk include individuals with a mechanical heart valve, individuals with the irregular heart rhythm called atrial fibrillation, and individuals with certain clotting disorders.   Warfarin is also used in individuals who have a history of developing harmful clots, including individuals who have had a stroke, heart attack, a clot which has traveled to the lung (pulmonary embolism), or a blood clot in the leg (deep venous thrombosis or DVT).   Warfarin may be used to prevent the growth of an existing clot, such as a DVT.  HOW IS THE EFFECT OF WARFARIN MONITORED? The goal of warfarin therapy is to lessen the clotting tendency of blood, but not to prevent clotting completely. Therefore, the anticoagulation effect of warfarin must be carefully watched using laboratory blood tests.  The test most commonly used to measure the effects of warfarin is the prothrombin time ("protime", or PT).   The PT is also used to compute a value known as the International Normalized Ratio (INR).   The longer it takes the blood to clot, the higher the PT and INR. Your caregiver will tell you what your "target" INR range is. In most cases, the target range will be 2 to 3, although other ranges may  be chosen. If, at any time, your INR is below the target range, there is a risk of clotting. If, on the other hand, your INR is above the target range, there is an increased risk of bleeding.  When warfarin is first prescribed, a higher "loading" dose may be given so that an effective blood level of the medication is reached quickly. The loading dose is then adjusted downward until a maintenance dose (a dose which is enough to keep the INR where it should be) is reached. Once you are on a stable maintenance dose, the PT and INR are watched less often, usually once every 2 to 4 weeks. The warfarin dose may be changed at times in response to a changing INR or to medical changes that call for an increase or decrease in warfarin therapy. It is important to keep all laboratory and caregiver follow-up appointments! Not keeping appointments could result in a chronic or permanent injury, pain, or disability. WHAT ARE THE SIDE EFFECTS OF WARFARIN?  Excessive blood thinning can cause bleeding (hemorrhage) from any part of the body. Individuals on warfarin should report any falls or accidents, or any symptoms of bleeding or unusual bruising. Signs of unusual bleeding may include bleeding from the gums, blood in the urine, bloody or dark stool, a nosebleed, coughing up blood, or vomiting blood. Because the risk of bleeding increases as  the INR rises above the desired limit, the INR is closely watched during warfarin therapy. Adjustments are made as needed to keep the INR at an acceptable level (within the target range).   Other body systems can be affected by warfarin. In addition to any signs of bleeding, patients should report skin rash or irritation, unusual fever, continual nausea or stomach upset, severe pain in the joints or back, swelling or pain at an injection site, or symptoms of a stroke.  IS WARFARIN SAFE TO USE DURING PREGNANCY?  Warfarin is generally not advised during pregnancy, at least during the first  trimester, due to an increased risk of birth defects. A woman who becomes pregnant or plans to become pregnant while on warfarin therapy should notify the caregiver immediately.   Although warfarin does not pass into breast milk, a woman who wishes to breastfeed while taking warfarin should also consult with her caregiver.  ARE THERE SPECIAL PRECAUTIONS TO FOLLOW WHILE TAKING WARFARIN? Follow the dosage schedule carefully. Warfarin should be taken exactly as directed. A missed or extra dose requires a call to the prescribing caregiver for advice. Do not change the dose of warfarin on your own to make up for missed or extra doses. It is very important to take warfarin as directed since bleeding or blood clots could result in chronic or permanent injury, pain, or disability. Warfarin tablets come in different strengths. Each tablet strength is usually a different color, with the amount of warfarin (in milligrams) clearly printed on the tablet. You should immediately report to the pharmacist or caregiver any prescription which is different than the previous one, to make sure that you are taking the correct dose. Avoid situations that cause bleeding. You may have a tendency to bleed more easily than usual while taking warfarin. Some simple changes which can limit bleeding are:  Use a softer toothbrush.   Floss with waxed floss rather than unwaxed floss.   Shave with an electric razor rather than a blade.   Limit use of sharp objects.   Avoid potentially harmful activities (such as contact sports).  Medical conditions. Medical conditions which increase your clotting time include:  Diarrhea.   Worsening of heart failure.   Fever.   Worsening of liver function.  Alcohol use. Chronic use of alcohol affects the body's ability to handle warfarin. You should avoid alcohol or consume it only in very limited quantities. General alcohol intake guidelines are 1 drink for women and 2 drinks for men per  day. (1 drink = 5 ounces of wine, 12 ounces of beer or 1 ounces of liquor). Other medications. A number of drugs can interact with warfarin. Some of the most common over-the-counter pain relievers (acetaminophen, aspirin, and nonsteroidal anti-inflammatory medications) make the anticoagulant effects of warfarin stronger. If these medicines are taken, your caregiver may need to adjust the dose of warfarin. Talk to your caregiver if you have problems or questions. Dietary considerations. Some foods and supplements may interfere with warfarin's effectiveness. Examples appear below. Once you are stabilized on a warfarin regimen, no major dietary changes should be made without consulting your caregiver. Vitamin K. Foods high in vitamin K can cause warfarin to be less effective. You should avoid eating very large amounts of foods known to be high in vitamin K. The serving size for foods high in vitamin K are  cup cooked and 1 cup raw, unless otherwise noted, including:  Beef liver (3.5 ounces).   Garbanzo beans.   Kale.  Spinach.   Nettle greens.   Swiss chard.   Pork liver (3.5 ounces).   Broccoli.   Cabbage.   Watercress.   Soybeans.   Endive.   Green tea (made with  ounce dried tea).   Brussels sprouts.   Cauliflower.   Parsley (1 Tablespoon).   Collard greens.   Seaweed (limit 2 sheets).   Turnip greens.   Soybean oil.   Green peas.  Eating large amounts of these foods may seriously reduce how well a dose of warfarin works. It is not necessary to avoid these foods, but do not change the amount of vitamin K-containing foods you currently eat. Changing to a diet low in foods containing vitamin K may lead to an excessive warfarin effect. Do not drink herbal teas containing coumarin while taking this medication. IS IT SAFE TO USE SUPPLEMENTS WHILE TAKING WARFARIN?  Vitamin E. Vitamin E may increase the anticoagulant effects of warfarin. You should consult your caregiver  before adding or changing a dose of vitamin E or any other vitamin.   Check other supplements such as multivitamins and calcium supplements. These may contain vitamin K.  FOR MORE INFORMATION Your caregiver is the best resource for important information related to your particular case. Because every person is different, it is important that your situation is looked at by someone who knows you as a whole person and also knows the specifics of your condition. Document Released: 10/02/2005 Document Revised: 09/21/2011 Document Reviewed: 12/25/2006 [employer] Patient Information 2012 [employer]LC.

## 2012-01-12 LAB — BASIC METABOLIC PANEL
BUN: 21 mg/dL (ref 6–23)
CO2: 29 mEq/L (ref 19–32)
Chloride: 94 mEq/L — ABNORMAL LOW (ref 96–112)
Glucose, Bld: 118 mg/dL — ABNORMAL HIGH (ref 70–99)
Potassium: 4.4 mEq/L (ref 3.5–5.1)
Sodium: 134 mEq/L — ABNORMAL LOW (ref 135–145)

## 2012-01-12 LAB — GLUCOSE, CAPILLARY
Glucose-Capillary: 117 mg/dL — ABNORMAL HIGH (ref 70–99)
Glucose-Capillary: 138 mg/dL — ABNORMAL HIGH (ref 70–99)

## 2012-01-12 NOTE — Progress Notes (Signed)
Pt. Discharged 01/12/2012  3:12 PM Discharge instructions reviewed with patient/family. Patient/family verbalized understanding. All Rx's given. Questions answered as needed. Pt. Discharged to home with family/self.  Shamaria Kavan

## 2012-01-12 NOTE — Progress Notes (Signed)
Chest tube sutures removed per order and protocol- edges intact, benzoin and steristrips applied.

## 2012-01-12 NOTE — Discharge Summary (Signed)
Physician Discharge Summary  Patient ID: CYANNA NEACE MRN: [id number] DOB/AGE: [birth year] 76 y.o.  Admit date: 01/04/2012 Discharge date: 01/12/2012  Admission Diagnoses:  Patient Active Problem List  Diagnoses  . Mitral regurgitation  . Tricuspid regurgitation  . Atrial fibrillation, chronic  . CHF (congestive heart failure)  . Anemia  . Pulmonary hypertension  . Chronic renal insufficiency  . Type II diabetes mellitus  . Left bundle branch block  . Mitral valve disorders  . Diseases of tricuspid valve  . Congestive heart failure, unspecified  . Atrial fibrillation  . Nonischemic cardiomyopathy   Discharge Diagnoses:   Patient Active Problem List  Diagnoses  . Mitral regurgitation  . Tricuspid regurgitation  . Atrial fibrillation, chronic  . CHF (congestive heart failure)  . Anemia  . Pulmonary hypertension  . Chronic renal insufficiency  . Type II diabetes mellitus  . Left bundle branch block  . Mitral valve disorders  . Diseases of tricuspid valve  . Congestive heart failure, unspecified  . Atrial fibrillation  . S/P mitral valve repair  . S/P tricuspid valve repair  . S/P Maze operation for atrial fibrillation  . Atrioventricular block, complete   S/P Pacemaker placement  . Nonischemic cardiomyopathy    Discharged Condition: good  History of Present Illness:  Mrs. Magri is a 78 yo white female with known history of mitral regurgitation, atrial fibrillation, and congestive heart failure who presented to Dr. Cornelius Moras for evaluation for possible heart surgery. The patient states that she was first noted to have a heart murmur approximately 8 years ago when she presented with severe anemia and congestive heart failure. In May of 2009 she was hospitalized with congestive heart failure and rapid atrial fibrillation. Echocardiogram performed at that time confirming the presence of mitral regurgitation with normal left ventricular systolic function. Since then the  patient has developed progressive worsening symptoms of exertional shortness of breath and fatigue. In June 2012 she was hospitalized in [facility] with class IV congestive heart failure and respiratory failure. Echocardiogram performed at that time demonstrated severe global left ventricular dysfunction with severe mitral regurgitation and moderate tricuspid regurgitation. The patient underwent left and right heart catheterization demonstrating normal coronary artery anatomy with no significant coronary artery disease. The patient was seen in consultation at that time by Dr. Erby Pian and told that she probably would not tolerate surgery under the circumstances. The patient was treated medically and did reasonably well for a period of time, but she was rehospitalized in January 2013 with rapid atrial fibrillation class 4 congestive heart failure and pulmonary edema. I had the opportunity to see the patient in consultation on 10/30/2011 during that hospitalization. She had transesophageal echocardiogram performed on 11/01/2011. This revealed severe left ventricular dysfunction with severe mitral regurgitation and moderate to severe tricuspid regurgitation. Right heart catheterization was performed at that time demonstrating pulmonary artery pressure 33/17 with pulmonary catheter wedge pressure mean of 15 and large V waves. Cardiac output ranged between 3.1 and 3.4 L per minute corresponding to a cardiac index between 1.9 and 2.0. Mixed venous oxygen saturation was 55%. Central venous pressure was 5. Since then the patient has remained clinically stable from a cardiovascular standpoint. She underwent dental extraction a dental cleaning.  Dr. Cornelius Moras spoke with the patient regarding the benefits and risks of the procedure. The patient wishes to proceed with surgery which was scheduled for 01/04/2012.    Hospital Course:   Mrs. Brogden presented to [facility] on 01/04/2012 and was  taken to the OR.  She  underwent a Mitral Valve Repair utilizing a 26mm Sorin Memo 3D Ring Annuloplasty, Tricuspid Valve Repair utilzing an Edwards 26mm mc3 Ring Annuloplasty, a complete MAZE procedure with ligation of left atrial appendage, and placement of 2 epicardial pacemaker leads.  The pateint tolerated the procedure well and was taken to the surgical ICU intubated and in stable condition.  POD #0 the patient was extubated without difficulty.  POD #1 the patient was weaned off dopamine as tolerated.  Her mediastinal chest tubes were removed without difficulty.  POD #2 the patient continued to be pacer dependent.  EP service was consulted for permanent pacemaker.  POD #3  The patient was evaluated by EP who did not feel she would recover AV conduction.  They agreed she would benefit from permanent pacemaker.  POD #4 the patients pleural chest tubes were removed without difficulty. She was placed on coumadin and amiodarone.  She underwent permanent pacemaker placement utilizing DDD (BiV PPM) via her left subclavian.  POD #5  The EP service interrogated the patient's pacemaker.  The patient was transfused 1 unit of PRBCs and her temporary pacing wires were removed.  POD #8 the patient is doing well.  Her INR this morning was 1.48.  She will be discharged home on low dose coumadin.  She will need to have her INR checked on Monday at Dr. Valli Glance office.  She will also need to follow up with Dr. Lewayne Bunting in 2-3 months for pacemaker assessment.  The patient is medically stable and will be ready for discharge in the next 24-48 hours.   Consults: EP  Disposition: Final discharge disposition not confirmed  Discharge Orders    Future Appointments: Provider: Department: Dept Phone: Center:   02/05/2012 3:30 PM Purcell Nails, MD [facility] [phone] [id number]     Medication List  As of 01/12/2012 10:27 AM   STOP taking these medications         ALPRAZolam 0.25 MG tablet      digoxin 0.125 MG tablet      metoprolol  succinate 100 MG 24 hr tablet      promethazine 12.5 MG tablet         TAKE these medications         amiodarone 200 MG tablet   Commonly known as: PACERONE   Take 1 tablet (200 mg total) by mouth 2 (two) times daily after a meal.      atorvastatin 40 MG tablet   Commonly known as: LIPITOR   Take 40 mg by mouth at bedtime.      dicyclomine 20 MG tablet   Commonly known as: BENTYL   Take 20 mg by mouth 2 (two) times daily.      ferrous sulfate 325 (65 FE) MG tablet   Take 325 mg by mouth daily with breakfast.      furosemide 20 MG tablet   Commonly known as: LASIX   Take 20 mg by mouth daily.      gemfibrozil 600 MG tablet   Commonly known as: LOPID   Take 600 mg by mouth 2 (two) times daily before a meal.      losartan 100 MG tablet   Commonly known as: COZAAR   Take 50 mg by mouth daily.      metoprolol succinate 25 MG 24 hr tablet   Commonly known as: TOPROL-XL   Take 25 mg by mouth at bedtime.      ONGLYZA  5 MG Tabs tablet   Generic drug: saxagliptin HCl   Take 5 mg by mouth daily.      traMADol 50 MG tablet   Commonly known as: ULTRAM   Take 1-2 tablets (50-100 mg total) by mouth every 6 (six) hours as needed.      vitamin B-12 500 MCG tablet   Commonly known as: CYANOCOBALAMIN   Take 500 mcg by mouth 2 (two) times daily.      warfarin 2 MG tablet   Commonly known as: COUMADIN   Take 1 tablet (2 mg total) by mouth daily at 6 PM.           Follow-up Information    Follow up with Purcell Nails, MD. (See Dr. Cornelius Moras in on April 22 at 3:30 PM; these obtain a chest x-ray at 9:30 AM at [facility] imaging in the same office complex.)    Contact information:   [address] [place] [address] [phone]       Follow up with Karolee Ohs, MD. (2 weeks-please call office to arrange appointment. Also obtain a PT/INR blood test next week in his office on Tuesday or Wednesday-please call to arrange.)    Contact information:     [address] [place] [address] [phone]       Follow up with Lewayne Bunting, MD. (Please call his office to arrange appointment to have wound check for pacemaker in 7-10 days; also arrange to see him in 2-3 months.)    Contact information:   [address] [place] [address] [phone]          Signed: Lowella Dandy 01/12/2012, 10:27 AM

## 2012-01-12 NOTE — Progress Notes (Signed)
CARDIAC REHAB PHASE I   PRE:  Rate/Rhythm: 70 paced    BP: sitting 100/60    SaO2: 94 RA  MODE:  Ambulation: 550 ft   POST:  Rate/Rhythm: 73 pacing    BP: sitting 110/50     SaO2: 99 RA  Tolerated well with RW. VSS. Ed completed.  [id number]  Harriet Masson CES, ACSM

## 2012-01-12 NOTE — Progress Notes (Signed)
4 Days Post-Op Procedure(s) (LRB): PERMANENT PACEMAKER INSERTION (N/A)  Subjective: Patient is feeling fairly well. Hopes to go home today.  Objective: Vital signs in last 24 hours: Patient Vitals for the past 24 hrs:  BP Temp Temp src Pulse Resp SpO2 Weight  01/12/12 0422 109/61 mmHg 97.8 F (36.6 C) Oral 70  16  93 % 140 lb 14 oz (63.9 kg)  01/11/12 2003 104/58 mmHg 97.9 F (36.6 C) Oral 70  18  100 % -  01/11/12 1428 101/58 mmHg 97.4 F (36.3 C) Oral 70  20  98 % -   Pre op weight  66.7 kg Current Weight  01/12/12 140 lb 14 oz (63.9 kg)       Intake/Output from previous day: 03/28 0701 - 03/29 0700 In: 1320 [P.O.:1320] Out: 401 [Urine:400; Stool:1]   Physical Exam:  Cardiovascular: RRR, no murmurs, gallops, or rubs. Pulmonary: Diminished at the bases; no rales, wheezes, or rhonchi. Abdomen: Soft, non tender, bowel sounds present. Extremities: Trace bilateral lower extremity edema. Wounds: Clean and dry.  No erythema or signs of infection.  Lab Results: CBC: Basename 01/10/12 0530  WBC 8.8  HGB 9.9*  HCT 29.0*  PLT 177   BMET:  Basename 01/12/12 0600 01/10/12 0530  NA 134* 132*  K 4.4 3.4*  CL 94* 94*  CO2 29 31  GLUCOSE 118* 130*  BUN 21 19  CREATININE 1.11* 0.84  CALCIUM 9.0 8.9    PT/INR:  Basename 01/12/12 0600  LABPROT 18.2*  INR 1.48   ABG:  INR: Will add last result for INR, ABG once components are confirmed Will add last 4 CBG results once components are confirmed  Assessment/Plan:  1. CV - AV paced at 80 (s/p PPM 3/25).On Amiodarone 200 bid and Toprol XL 25 daily, and Coumadin.Will keep INR on low side as has had occult GI blood loss in past. 2.  Pulmonary - Encourage incentive spirometer. 3. Volume Overload - On Lasix 40 bid. 4.  Acute blood loss anemia - Last H/H 9.9/29.Continue ferrous sulfate. 5.DM-CBGs 96/130/117. Glucose well controlled so continue Tradjenta. 6.Possible discharge later today.   Hendrik Donath  MPA-C 01/12/2012

## 2012-01-13 LAB — TYPE AND SCREEN
ABO/RH(D): A POS
Antibody Screen: NEGATIVE
Unit division: 0

## 2012-01-13 NOTE — Discharge Summary (Signed)
I agree with the above discharge summary and plan for follow-up.  Jerauld Bostwick H  

## 2012-01-18 ENCOUNTER — Ambulatory Visit (INDEPENDENT_AMBULATORY_CARE_PROVIDER_SITE_OTHER): Payer: Medicare Other | Admitting: *Deleted

## 2012-01-18 ENCOUNTER — Encounter: Payer: Self-pay | Admitting: Internal Medicine

## 2012-01-18 DIAGNOSIS — I442 Atrioventricular block, complete: Secondary | ICD-10-CM

## 2012-01-18 DIAGNOSIS — I509 Heart failure, unspecified: Secondary | ICD-10-CM

## 2012-01-18 LAB — PACEMAKER DEVICE OBSERVATION
AL AMPLITUDE: 2.5 mv
AL IMPEDENCE PM: 437 Ohm
ATRIAL PACING PM: 70.15
LV LEAD THRESHOLD: 3 V
RV LEAD IMPEDENCE PM: 494 Ohm
RV LEAD THRESHOLD: 0.875 V

## 2012-01-18 NOTE — Progress Notes (Signed)
Wound check-PPM 

## 2012-01-31 ENCOUNTER — Other Ambulatory Visit: Payer: Self-pay | Admitting: Thoracic Surgery (Cardiothoracic Vascular Surgery)

## 2012-01-31 DIAGNOSIS — I059 Rheumatic mitral valve disease, unspecified: Secondary | ICD-10-CM

## 2012-02-02 ENCOUNTER — Ambulatory Visit
Admission: RE | Admit: 2012-02-02 | Discharge: 2012-02-02 | Disposition: A | Discharge status: Home / Self Care | Payer: Medicare Other | Source: Ambulatory Visit | Attending: Thoracic Surgery (Cardiothoracic Vascular Surgery) | Admitting: Thoracic Surgery (Cardiothoracic Vascular Surgery)

## 2012-02-02 DIAGNOSIS — I059 Rheumatic mitral valve disease, unspecified: Secondary | ICD-10-CM

## 2012-02-05 ENCOUNTER — Ambulatory Visit: Payer: Self-pay | Admitting: Thoracic Surgery (Cardiothoracic Vascular Surgery)

## 2012-02-06 ENCOUNTER — Other Ambulatory Visit: Payer: Self-pay | Admitting: Thoracic Surgery (Cardiothoracic Vascular Surgery)

## 2012-02-06 DIAGNOSIS — I059 Rheumatic mitral valve disease, unspecified: Secondary | ICD-10-CM

## 2012-02-09 ENCOUNTER — Encounter: Payer: Self-pay | Admitting: Thoracic Surgery (Cardiothoracic Vascular Surgery)

## 2012-02-09 ENCOUNTER — Ambulatory Visit (INDEPENDENT_AMBULATORY_CARE_PROVIDER_SITE_OTHER): Payer: Self-pay | Admitting: Thoracic Surgery (Cardiothoracic Vascular Surgery)

## 2012-02-09 VITALS — BP 138/86 | HR 90 | Resp 20 | Ht 62.0 in | Wt 142.0 lb

## 2012-02-09 DIAGNOSIS — I059 Rheumatic mitral valve disease, unspecified: Secondary | ICD-10-CM

## 2012-02-09 DIAGNOSIS — Z9889 Other specified postprocedural states: Secondary | ICD-10-CM

## 2012-02-09 DIAGNOSIS — I340 Nonrheumatic mitral (valve) insufficiency: Secondary | ICD-10-CM

## 2012-02-09 DIAGNOSIS — I079 Rheumatic tricuspid valve disease, unspecified: Secondary | ICD-10-CM

## 2012-02-09 DIAGNOSIS — Z8679 Personal history of other diseases of the circulatory system: Secondary | ICD-10-CM

## 2012-02-09 DIAGNOSIS — I071 Rheumatic tricuspid insufficiency: Secondary | ICD-10-CM

## 2012-02-09 DIAGNOSIS — I4891 Unspecified atrial fibrillation: Secondary | ICD-10-CM

## 2012-02-09 NOTE — Patient Instructions (Signed)
The patient has been instructed that they may return driving an automobile as long as they are no longer requiring oral narcotic pain relievers during the daytime.  They have been advised to start driving short distances during the daylight and gradually increase from there as they feel comfortable.  The patient has been reminded to continue to avoid any heavy lifting or strenuous use of arms or shoulders for at least a total of three months from the time of surgery.  I've strongly encourage the patient to enroll in the cardiac rehabilitation program.

## 2012-02-09 NOTE — Progress Notes (Signed)
[address]            [name] [address]          [phone]     CARDIOTHORACIC SURGERY OFFICE NOTE  Referring Provider is Karolee Ohs., MD PCP is Jolene Provost, MD, MD   HPI:  Patient returns for routine followup status post mitral valve repair, tricuspid valve repair, Maze procedure, and placement of left ventricular epicardial pacemaker leads on 01/04/2012.  She subsequently underwent placement of a dual-chamber biventricular pacemaker on 01/08/2012 by Dr. Ladona Ridgel. Postoperatively she has done quite well. Since hospital discharge she has reportedly been seen in followup by Dr. Bary Castilla and her Coumadin dose has been monitored carefully and adjusted. The patient reports doing very well. She admits that she now feels much better than she did prior to surgery. Her breathing is substantially improved. She has not had any tachypalpitations or dizzy spells. She has mild soreness in her chest primarily around her pacemaker. Her appetite is reasonably good. She is sleeping fairly well at night. Overall she has no complaints. Home health physical therapy stopped quickly after it was started because the patient is progressing to rapidly. She has not started outpatient cardiac rehabilitation program.   Current Outpatient Prescriptions  Medication Sig Dispense Refill  . ALPRAZolam (XANAX) 0.25 MG tablet Take 0.25 mg by mouth 2 (two) times daily.       Marland Kitchen amiodarone (PACERONE) 200 MG tablet Take 200 mg by mouth daily.      Marland Kitchen atorvastatin (LIPITOR) 40 MG tablet Take 40 mg by mouth at bedtime.       . dicyclomine (BENTYL) 20 MG tablet Take 20 mg by mouth 2 (two) times daily.       . ferrous sulfate 325 (65 FE) MG tablet Take 325 mg by mouth daily with breakfast.      . furosemide (LASIX) 20 MG tablet Take 20 mg by mouth daily.      Marland Kitchen gemfibrozil (LOPID) 600 MG tablet Take 600 mg by mouth 2 (two) times daily before a meal.      . gemfibrozil (LOPID) 600 MG tablet  Take 600 mg by mouth 2 (two) times daily.      Marland Kitchen losartan (COZAAR) 100 MG tablet Take 100 mg by mouth daily.       . metoprolol succinate (TOPROL-XL) 25 MG 24 hr tablet Take 25 mg by mouth at bedtime.      . saxagliptin HCl (ONGLYZA) 5 MG TABS tablet Take 5 mg by mouth daily.      . traMADol (ULTRAM) 50 MG tablet Take 50 mg by mouth every 6 (six) hours as needed.       . vitamin B-12 (CYANOCOBALAMIN) 500 MCG tablet Take 1,000 mcg by mouth daily.       Marland Kitchen warfarin (COUMADIN) 2 MG tablet Take 1 tablet (2 mg total) by mouth daily at 6 PM.  60 tablet  1  . DISCONTD: amiodarone (PACERONE) 200 MG tablet Take 1 tablet (200 mg total) by mouth 2 (two) times daily after a meal.  60 tablet  1  . DISCONTD: polysaccharide iron (NIFEREX) 150 MG CAPS capsule Take 150 mg by mouth daily.          Physical Exam:   BP 138/86  Pulse 90  Resp 20  Ht 5\' 2"  (1.575 m)  Wt 142 lb (64.411 kg)  BMI  25.97 kg/m2  SpO2 98%  General:  Well-appearing  Chest:   Clear to auscultation with symmetrical breath sounds  CV:   Regular rate and rhythm with no murmur  Incisions:  Clean and dry and healing nicely  Abdomen:  Soft and nontender  Extremities:  Warm and well-perfused with no lower extremity edema  Diagnostic Tests:  *RADIOLOGY REPORT*  Clinical Data: Mitral valve disorder. Recent heart surgery.  CHEST - 2 VIEW 02/02/2012 Comparison: Portal chest 01/09/2012.  Findings: The patient is status post mitral and tricuspid valve  annuloplasty. Internal and external pacing wires are in place.  The heart is mildly enlarged. Aeration is improved bilaterally.  There is some residual right basilar airspace disease. No definite  effusions are evident.  IMPRESSION:  1. Improved aeration with some residual right lower lobe airspace  disease, likely reflecting atelectasis.  2. Resolution of bilateral pleural effusions.  3. Postoperative changes as above.  Original Report Authenticated By: Jamesetta Orleans. MATTERN, M.D.          Impression:  Patient is doing exceptionally well just one month following mitral valve repair, tricuspid valve repair, Maze procedure, and placement of dual-chamber biventricular pacing system. She looks and feels much better than she did prior to surgery. She is getting around quite well and appears to be maintaining a stable paced rhythm. So far she is tolerating Coumadin despite the fact she has history of GI bleeding in the distant past.   Plan:  I've encouraged patient to continue to gradually increase her physical activity as tolerated with exception of the fact that she should continue to refrain from any sort of heavy lifting or pulling or pushing with her arms and shoulders. I've strongly encourage her to get enrolled in the cardiac rehabilitation program at [facility]. She is scheduled to have a followup appointment with Dr. Ladona Ridgel to check her pacemaker, after which time long-term followup of her pacemaker can be transferred to Dr. Bary Castilla. I've instructed the patient to stop taking amiodarone when her current prescription runs out. As long as she continues to do well I would favor continuing Coumadin for 2 more months. However, Coumadin could be discontinued with fairly low risk at any time if necessary as her left atrial appendage has been clipped and she seems to be maintaining a very stable rhythm.  We will plan to see her back in 2 months.   Salvatore Decent. Cornelius Moras, MD 02/09/2012 3:11 PM

## 2012-04-08 ENCOUNTER — Ambulatory Visit (INDEPENDENT_AMBULATORY_CARE_PROVIDER_SITE_OTHER): Payer: Medicare Other | Admitting: Thoracic Surgery (Cardiothoracic Vascular Surgery)

## 2012-04-08 ENCOUNTER — Encounter: Payer: Self-pay | Admitting: Thoracic Surgery (Cardiothoracic Vascular Surgery)

## 2012-04-08 VITALS — BP 123/77 | HR 96 | Resp 20 | Ht 62.0 in | Wt 142.0 lb

## 2012-04-08 DIAGNOSIS — I4891 Unspecified atrial fibrillation: Secondary | ICD-10-CM

## 2012-04-08 DIAGNOSIS — Z9889 Other specified postprocedural states: Secondary | ICD-10-CM

## 2012-04-08 DIAGNOSIS — Z8679 Personal history of other diseases of the circulatory system: Secondary | ICD-10-CM

## 2012-04-08 NOTE — Progress Notes (Signed)
[address]            [place] [address]          [phone]     CARDIOTHORACIC SURGERY OFFICE NOTE  Referring Provider is Karolee Ohs., MD PCP is Jolene Provost, MD   HPI:  Patient returns for routine followup now 3 months following mitral valve repair, tricuspid valve repair, and Maze procedure. She additionally underwent placement of biventricular permanent pacemaker by Dr. Ladona Ridgel. She was last seen here in the office on 02/09/2012. Since then she has continued to do exceptionally well. She denies any shortness of breath. She has no residual pain in her chest. Her activity level is quite good and she wants to start mowing her lawn. She's not had any tachypalpitations whatsoever. Overall she has no complaints.   Current Outpatient Prescriptions  Medication Sig Dispense Refill  . ALPRAZolam (XANAX) 0.25 MG tablet Take 0.25 mg by mouth at bedtime as needed.       Marland Kitchen atorvastatin (LIPITOR) 40 MG tablet Take 40 mg by mouth at bedtime.       Jennette Banker Sodium 30-100 MG CAPS Take by mouth as needed.      . dicyclomine (BENTYL) 20 MG tablet Take 20 mg by mouth 2 (two) times daily.       . ferrous sulfate 325 (65 FE) MG tablet Take 325 mg by mouth daily with breakfast.      . furosemide (LASIX) 20 MG tablet Take 20 mg by mouth daily.      Marland Kitchen gemfibrozil (LOPID) 600 MG tablet Take 600 mg by mouth 2 (two) times daily before a meal.      . losartan (COZAAR) 100 MG tablet Take 100 mg by mouth daily.       . metoprolol succinate (TOPROL-XL) 25 MG 24 hr tablet Take 25 mg by mouth at bedtime.      . saxagliptin HCl (ONGLYZA) 5 MG TABS tablet Take 5 mg by mouth daily.      . vitamin B-12 (CYANOCOBALAMIN) 500 MCG tablet Take 1,000 mcg by mouth daily.       Marland Kitchen warfarin (COUMADIN) 2 MG tablet Take 1 tablet (2 mg total) by mouth daily at 6 PM.  60 tablet  1  . DISCONTD: gemfibrozil (LOPID) 600 MG tablet Take 600 mg by mouth 2 (two) times daily.      Marland Kitchen  DISCONTD: polysaccharide iron (NIFEREX) 150 MG CAPS capsule Take 150 mg by mouth daily.          Physical Exam:   BP 123/77  Pulse 96  Resp 20  Ht 5\' 2"  (1.575 m)  Wt 142 lb (64.411 kg)  BMI 25.97 kg/m2  SpO2 98%  General:  Well-appearing  Chest:   Clear to auscultation  CV:   Regular rate and rhythm without murmur  Incisions:  Surgical incisions have healed nicely and the sternum is stable  Abdomen:  Soft and nontender  Extremities:  Warm and well-perfused  Diagnostic Tests:  2 channel telemetry rhythm strip demonstrates AV paced rhythm   Impression:  Patient is doing exceptionally well 3 months following mitral valve repair, tricuspid valve repair, and Maze procedure. She and appears to be maintaining stable paced rhythm. Her functional status is quite good.  Plan:  We will plan to see the patient back in 3 months for routine followup and rhythm check. I  think it would be reasonable to interrogator pacemaker, and if she is not having any periods of atrial fibrillation it would be reasonable to discontinue Coumadin therapy.   Salvatore Decent. Cornelius Moras, MD 04/08/2012 12:11 PM

## 2012-04-23 ENCOUNTER — Ambulatory Visit (INDEPENDENT_AMBULATORY_CARE_PROVIDER_SITE_OTHER): Payer: Medicare Other | Admitting: Internal Medicine

## 2012-04-23 ENCOUNTER — Encounter: Payer: Self-pay | Admitting: Internal Medicine

## 2012-04-23 VITALS — BP 120/72 | HR 92 | Ht 62.0 in | Wt 144.1 lb

## 2012-04-23 DIAGNOSIS — Z45018 Encounter for adjustment and management of other part of cardiac pacemaker: Secondary | ICD-10-CM

## 2012-04-23 DIAGNOSIS — I509 Heart failure, unspecified: Secondary | ICD-10-CM

## 2012-04-23 DIAGNOSIS — I442 Atrioventricular block, complete: Secondary | ICD-10-CM

## 2012-04-23 DIAGNOSIS — I4891 Unspecified atrial fibrillation: Secondary | ICD-10-CM

## 2012-04-23 LAB — PACEMAKER DEVICE OBSERVATION
AL IMPEDENCE PM: 456 Ohm
AL THRESHOLD: 0.625 V
ATRIAL PACING PM: 12.62
BAMS-0001: 170 {beats}/min
BATTERY VOLTAGE: 2.9858 V
LV LEAD THRESHOLD: 4.75 V
RV LEAD AMPLITUDE: 12.625 mv

## 2012-04-23 NOTE — Patient Instructions (Addendum)
Stop taking Coumadin 

## 2012-04-23 NOTE — Progress Notes (Signed)
HPI Tara Silva returns today for followup. She is a very pleasant 78 year old woman with a history of mitral valve repair, compensated by complete heart block, status post biventricular pacemaker secondary to all the above in the setting of chronic systolic heart failure. The patient states that she feels better than she has in 5 years. Postoperatively, she developed atrial fibrillation and this has resolved. She denies chest pain. She is walking several miles a day. No peripheral edema. No syncope. Interrogation of her pacemaker demonstrates no episodes of atrial fibrillation. Allergies  Allergen Reactions  . Aspirin Palpitations  . Sudafed (Pseudoephedrine Hcl) Other (See Comments)    "Scalp crawls"     Current Outpatient Prescriptions  Medication Sig Dispense Refill  . ALPRAZolam (XANAX) 0.25 MG tablet Take 0.25 mg by mouth at bedtime as needed.       Marland Kitchen atorvastatin (LIPITOR) 40 MG tablet Take 40 mg by mouth at bedtime.       Jennette Banker Sodium 30-100 MG CAPS Take by mouth as needed.      . dicyclomine (BENTYL) 20 MG tablet Take 20 mg by mouth 2 (two) times daily.       . ferrous sulfate 325 (65 FE) MG tablet Take 325 mg by mouth daily with breakfast.      . furosemide (LASIX) 20 MG tablet Take 20 mg by mouth daily.      Marland Kitchen gemfibrozil (LOPID) 600 MG tablet Take 600 mg by mouth 2 (two) times daily before a meal.      . losartan (COZAAR) 100 MG tablet Take 100 mg by mouth daily.       . metoprolol succinate (TOPROL-XL) 25 MG 24 hr tablet Take 25 mg by mouth at bedtime.      . saxagliptin HCl (ONGLYZA) 5 MG TABS tablet Take 5 mg by mouth daily.      . vitamin B-12 (CYANOCOBALAMIN) 500 MCG tablet Take 1,000 mcg by mouth daily.       Marland Kitchen warfarin (COUMADIN) 2 MG tablet Take 1 tablet (2 mg total) by mouth daily at 6 PM.  60 tablet  1  . DISCONTD: polysaccharide iron (NIFEREX) 150 MG CAPS capsule Take 150 mg by mouth daily.         Past Medical History  Diagnosis Date  .  Congestive heart failure (CHF)     chronic systolic and diastolic  . Pulmonary hypertension   . Hypertension   . Anemia     chronic  . Chronic kidney disease     chronic renal insufficiency  . Hyperlipidemia   . Mitral regurgitation     s/p repair  . Tricuspid regurgitation     s/p repair  . Heart murmur   . Shortness of breath   . Left bundle branch block   . Atrial fibrillation   . Diabetes mellitus     Type 2 Niddm X 15 years  . Chronic renal disease, stage III   . Irritable bowel syndrome with diarrhea   . Arthritis   . Anxiety   . Supplemental oxygen dependent     2 l @ night;nasal cannula  . S/P mitral valve repair 01/04/2012    26mm Sorin Memo 3D ring annuloplasty for type I mitral valve dysfunction  . S/P tricuspid valve repair 01/04/2012    26mm Edwards mc3 ring annuloplasty for type I tricuspid valve dysfunction  . S/P Maze operation for atrial fibrillation 01/04/2012    Complete biatrial lesion set using bipolar radiofrequency and cryothermy  ablation with clipping of LA appendage    ROS:   All systems reviewed and negative except as noted in the HPI.   Past Surgical History  Procedure Date  . Replacement total knee 2004    Right knee  . Bladder suspension   . Tonsillectomy   . Total abdominal hysterectomy w/ bilateral salpingoophorectomy   . Joint replacement 2007    Right knee  . Cardiac catheterization 10/2011  . Abdominal hysterectomy   . Mitral valve repair 01/04/2012    Procedure: MITRAL VALVE REPAIR (MVR);  Surgeon: Purcell Nails, MD;  Location: [facility] OR;  Service: Open Heart Surgery;  Laterality: N/A;  . Tricuspid valve replacement 01/04/2012    Procedure: TRICUSPID VALVE REPAIR;  Surgeon: Purcell Nails, MD;  Location: [facility] OR;  Service: Open Heart Surgery;  Laterality: N/A;  . Maze 01/04/2012    Procedure: MAZE;  Surgeon: Purcell Nails, MD;  Location: [facility] OR;  Service: Open Heart Surgery;  Laterality: N/A;     Family History  Problem Relation Age  of Onset  . Anesthesia problems Neg Hx      History   Social History  . Marital Status: Widowed    Spouse Name: N/A    Number of Children: N/A  . Years of Education: N/A   Occupational History  . Not on file.   Social History Main Topics  . Smoking status: Never Smoker   . Smokeless tobacco: Former Neurosurgeon    Types: Chew    Quit date: 10/16/1990  . Alcohol Use: No  . Drug Use: No  . Sexually Active: No   Other Topics Concern  . Not on file   Social History Narrative  . No narrative on file     BP 120/72  Pulse 92  Ht 5\' 2"  (1.575 m)  Wt 144 lb 1.9 oz (65.372 kg)  BMI 26.36 kg/m2  Physical Exam:  Well appearing NAD HEENT: Unremarkable Neck:  No JVD, no thyromegally Lungs:  Clear with no wheezes, rales, or rhonchi. HEART:  Regular rate rhythm, no murmurs, no rubs, no clicks Abd:  soft, positive bowel sounds, no organomegally, no rebound, no guarding Ext:  2 plus pulses, no edema, no cyanosis, no clubbing Skin:  No rashes no nodules Neuro:  CN II through XII intact, motor grossly intact   DEVICE  Normal device function.  See PaceArt for details.   Assess/Plan:

## 2012-04-23 NOTE — Assessment & Plan Note (Signed)
Her chronic systolic heart failure appears to be well compensated. She will continue her current medications and maintain a low-sodium diet. She is exercising daily and I've encouraged her to continue this.

## 2012-04-23 NOTE — Assessment & Plan Note (Signed)
Her device is working normally with acceptable pacing and sensing thresholds. She will followup with her doctors in [facility]. I will be available on an as-needed basis.

## 2012-04-23 NOTE — Assessment & Plan Note (Signed)
The patient has maintaining sinus rhythm. From my perspective, it would be acceptable for her to stop Coumadin. If she had recurrent atrial fibrillation, it would be advisable to restart Coumadin. She is currently maintaining sinus rhythm in the absence of any antiarrhythmic drug therapy.

## 2012-07-15 ENCOUNTER — Ambulatory Visit (INDEPENDENT_AMBULATORY_CARE_PROVIDER_SITE_OTHER): Payer: Medicare Other | Admitting: Thoracic Surgery (Cardiothoracic Vascular Surgery)

## 2012-07-15 ENCOUNTER — Encounter: Payer: Self-pay | Admitting: Thoracic Surgery (Cardiothoracic Vascular Surgery)

## 2012-07-15 VITALS — BP 107/72 | HR 86 | Resp 18 | Ht 62.0 in | Wt 146.0 lb

## 2012-07-15 DIAGNOSIS — Z9889 Other specified postprocedural states: Secondary | ICD-10-CM

## 2012-07-15 DIAGNOSIS — Z8679 Personal history of other diseases of the circulatory system: Secondary | ICD-10-CM

## 2012-07-15 NOTE — Progress Notes (Signed)
                   [address]            [name] [address]          [phone]     CARDIOTHORACIC SURGERY OFFICE NOTE  Referring Provider is Karolee Ohs., MD PCP is Jolene Provost, MD   HPI:  Patient returns for routine followup 6 months status post mitral valve repair, tricuspid valve repair, and Maze procedure. She was last seen here in our office on 04/08/2012. Since then she has continued to do exceptionally well. She reports that her exercise tolerance is much better than it has been in several years. She only gets short of breath if she really pushes herself physically. She has no pain in her chest at all. She's never had any tachypalpitations. She no longer has problems with lower extremity edema. She reports her breathing is quite good and overall she feels very well. She has no complaints.   Current Outpatient Prescriptions  Medication Sig Dispense Refill  . ALPRAZolam (XANAX) 0.25 MG tablet Take 0.25 mg by mouth at bedtime as needed.       Marland Kitchen atorvastatin (LIPITOR) 40 MG tablet Take 40 mg by mouth at bedtime.       . dicyclomine (BENTYL) 20 MG tablet Take 20 mg by mouth 2 (two) times daily.       . ferrous sulfate 325 (65 FE) MG tablet Take 325 mg by mouth daily with breakfast.      . furosemide (LASIX) 20 MG tablet Take 20 mg by mouth daily.      Marland Kitchen gemfibrozil (LOPID) 600 MG tablet Take 600 mg by mouth 2 (two) times daily before a meal.      . losartan (COZAAR) 100 MG tablet Take 100 mg by mouth daily.       . metoprolol succinate (TOPROL-XL) 25 MG 24 hr tablet Take 25 mg by mouth at bedtime.      . saxagliptin HCl (ONGLYZA) 5 MG TABS tablet Take 5 mg by mouth daily.      Marland Kitchen DISCONTD: polysaccharide iron (NIFEREX) 150 MG CAPS capsule Take 150 mg by mouth daily.          Physical Exam:   BP 107/72  Pulse 86  Resp 18  Ht 5\' 2"  (1.575 m)  Wt 146 lb (66.225 kg)  BMI 26.70 kg/m2  SpO2 96%  General:  Well-appearing  Chest:   Clear to auscultation  with symmetrical breath sounds  CV:   Regular rate and rhythm without murmur  Incisions:  Completely healed  Abdomen:  Soft and nontender  Extremities:  Warm and well-perfused with no lower extremity edema  Diagnostic Tests:  2 channel telemetry rhythm strip demonstrates atrial paced rhythm   Impression:  The patient is doing exceptionally well approximately 6 months status post mitral valve repair, tricuspid valve repair, and Maze procedure. She is maintaining a stable paced rhythm. She has been taken off of Coumadin. She clinically is doing remarkably well.  Plan:  In the future the patient will call and return to see [place] as needed. She will continue to followup with Dr. Bary Castilla and Dr. William Hamburger for long-term management of her cardiac and other medical needs.  All of her questions been addressed.   Salvatore Decent. Cornelius Moras, MD 07/15/2012 2:53 PM

## 2012-10-28 ENCOUNTER — Telehealth: Payer: Self-pay | Admitting: Internal Medicine

## 2012-10-28 NOTE — Telephone Encounter (Signed)
PT NEEDS HER RECORDS SENT TO DR Bary Castilla PHONE# [phone] BECAUSE SHE IS TO GET PACER CHECKED AT DR. KALIL OFFICE DR. Ladona Ridgel ONLY WAS TO PUT PACER IN.

## 2013-07-16 DEATH — deceased

## 2014-09-24 ENCOUNTER — Encounter (HOSPITAL_COMMUNITY): Payer: Self-pay | Admitting: Internal Medicine

## 2018-12-08 ENCOUNTER — Emergency Department (HOSPITAL_BASED_OUTPATIENT_CLINIC_OR_DEPARTMENT_OTHER): Payer: Medicare HMO

## 2018-12-08 ENCOUNTER — Encounter (HOSPITAL_BASED_OUTPATIENT_CLINIC_OR_DEPARTMENT_OTHER): Payer: Self-pay | Admitting: Emergency Medicine

## 2018-12-08 ENCOUNTER — Emergency Department (HOSPITAL_BASED_OUTPATIENT_CLINIC_OR_DEPARTMENT_OTHER)
Admission: EM | Admit: 2018-12-08 | Discharge: 2018-12-08 | Disposition: A | Discharge status: Home / Self Care | Payer: Medicare HMO | Attending: Emergency Medicine | Admitting: Emergency Medicine

## 2018-12-08 ENCOUNTER — Other Ambulatory Visit: Payer: Self-pay

## 2018-12-08 DIAGNOSIS — N183 Chronic kidney disease, stage 3 (moderate): Secondary | ICD-10-CM | POA: Insufficient documentation

## 2018-12-08 DIAGNOSIS — Y9389 Activity, other specified: Secondary | ICD-10-CM | POA: Insufficient documentation

## 2018-12-08 DIAGNOSIS — Y998 Other external cause status: Secondary | ICD-10-CM | POA: Insufficient documentation

## 2018-12-08 DIAGNOSIS — Y92018 Other place in single-family (private) house as the place of occurrence of the external cause: Secondary | ICD-10-CM | POA: Diagnosis not present

## 2018-12-08 DIAGNOSIS — I5042 Chronic combined systolic (congestive) and diastolic (congestive) heart failure: Secondary | ICD-10-CM | POA: Diagnosis not present

## 2018-12-08 DIAGNOSIS — E1122 Type 2 diabetes mellitus with diabetic chronic kidney disease: Secondary | ICD-10-CM | POA: Insufficient documentation

## 2018-12-08 DIAGNOSIS — I130 Hypertensive heart and chronic kidney disease with heart failure and stage 1 through stage 4 chronic kidney disease, or unspecified chronic kidney disease: Secondary | ICD-10-CM | POA: Insufficient documentation

## 2018-12-08 DIAGNOSIS — Z79899 Other long term (current) drug therapy: Secondary | ICD-10-CM | POA: Diagnosis not present

## 2018-12-08 DIAGNOSIS — W08XXXA Fall from other furniture, initial encounter: Secondary | ICD-10-CM | POA: Diagnosis not present

## 2018-12-08 DIAGNOSIS — Z87891 Personal history of nicotine dependence: Secondary | ICD-10-CM | POA: Insufficient documentation

## 2018-12-08 DIAGNOSIS — Z952 Presence of prosthetic heart valve: Secondary | ICD-10-CM | POA: Diagnosis not present

## 2018-12-08 DIAGNOSIS — S3210XA Unspecified fracture of sacrum, initial encounter for closed fracture: Secondary | ICD-10-CM | POA: Diagnosis not present

## 2018-12-08 DIAGNOSIS — Z96651 Presence of right artificial knee joint: Secondary | ICD-10-CM | POA: Insufficient documentation

## 2018-12-08 DIAGNOSIS — S3992XA Unspecified injury of lower back, initial encounter: Secondary | ICD-10-CM | POA: Diagnosis present

## 2018-12-08 DIAGNOSIS — Z950 Presence of cardiac pacemaker: Secondary | ICD-10-CM | POA: Diagnosis not present

## 2018-12-08 MED ORDER — HYDROCODONE-ACETAMINOPHEN 7.5-325 MG PO TABS: 1.0000 | ORAL_TABLET | Freq: Four times a day (QID) | ORAL | 0 refills | Status: AC | PRN

## 2018-12-08 NOTE — ED Triage Notes (Signed)
L low back pain radiating down leg. Seen by PCP for xrays and given prednisone and pain medication. Is here today due to pain.

## 2018-12-08 NOTE — ED Notes (Signed)
Pt returned from CT °

## 2018-12-08 NOTE — Discharge Instructions (Addendum)
You have 3 breaks in your sacrum.  Weight-bear as tolerated.  Follow-up with your doctor.  Take the pain medicine as needed.  Take either this or the pain medicine had before.  Do not take both.

## 2018-12-08 NOTE — ED Provider Notes (Signed)
[facility] EMERGENCY DEPARTMENT Provider Note   CSN: [id number] Arrival date & time: 12/08/18  1047    History   Chief Complaint Chief Complaint  Patient presents with  . Back Pain    HPI Tara Silva is a 85 y.o. female.     HPI Patient presents with low back pain.  Has had over the last 3 weeks.  Started after she slid off the couch and landed on her buttocks.  Since then has had pain.  Has seen PCP and had lumbar x-ray that showed an indeterminate age T12 compression fracture.  Had been given pain medicines but has continued pain.  Had been on Coumadin for atrial fibrillation but it is actually being held as of around a week ago for a procedure.  Pain goes down the left leg.  No weakness but is worse with movement.  No loss of bladder or bowel control. Past Medical History:  Diagnosis Date  . Anemia    chronic  . Anxiety   . Arthritis   . Atrial fibrillation (HCC)   . Chronic kidney disease    chronic renal insufficiency  . Chronic renal disease, stage III (HCC)   . Congestive heart failure (CHF) (HCC)    chronic systolic and diastolic  . Diabetes mellitus    Type 2 Niddm X 15 years  . Heart murmur   . Hyperlipidemia   . Hypertension   . Irritable bowel syndrome with diarrhea   . Left bundle branch block   . Mitral regurgitation    s/p repair  . Pulmonary hypertension (HCC)   . S/P Maze operation for atrial fibrillation 01/04/2012   Complete biatrial lesion set using bipolar radiofrequency and cryothermy ablation with clipping of LA appendage  . S/P mitral valve repair 01/04/2012   26mm Sorin Memo 3D ring annuloplasty for type I mitral valve dysfunction  . S/P tricuspid valve repair 01/04/2012   26mm Edwards mc3 ring annuloplasty for type I tricuspid valve dysfunction  . Shortness of breath   . Supplemental oxygen dependent    2 l @ night;nasal cannula  . Tricuspid regurgitation    s/p repair    Patient Active Problem List   Diagnosis Date Noted    . Biventricular pacemaker check 04/23/2012  . Atrioventricular block, complete (HCC) 01/07/2012  . Nonischemic cardiomyopathy (HCC) 01/07/2012  . S/P mitral valve repair 01/04/2012  . S/P tricuspid valve repair 01/04/2012  . S/P Maze operation for atrial fibrillation 01/04/2012  . Mitral valve disorders(424.0) 01/01/2012  . Diseases of tricuspid valve 01/01/2012  . Congestive heart failure, unspecified 01/01/2012  . Atrial fibrillation (HCC) 01/01/2012  . Atrial fibrillation, chronic 12/18/2011  . CHF (congestive heart failure) (HCC) 12/18/2011  . Anemia 12/18/2011  . Pulmonary hypertension (HCC) 12/18/2011  . Chronic renal insufficiency 12/18/2011  . Type II diabetes mellitus (HCC) 12/18/2011  . Left bundle branch block 12/18/2011  . Mitral regurgitation 11/13/2011  . Tricuspid regurgitation 11/13/2011    Past Surgical History:  Procedure Laterality Date  . ABDOMINAL HYSTERECTOMY    . BLADDER SUSPENSION    . CARDIAC CATHETERIZATION  10/2011  . JOINT REPLACEMENT  2007   Right knee  . MAZE  01/04/2012   Procedure: MAZE;  Surgeon: Purcell Nails, MD;  Location: [facility] OR;  Service: Open Heart Surgery;  Laterality: N/A;  . MITRAL VALVE REPAIR  01/04/2012   Procedure: MITRAL VALVE REPAIR (MVR);  Surgeon: Purcell Nails, MD;  Location: [facility] OR;  Service: Open Heart  Surgery;  Laterality: N/A;  . PERMANENT PACEMAKER INSERTION N/A 01/08/2012   Procedure: PERMANENT PACEMAKER INSERTION;  Surgeon: Marinus Maw, MD;  Location: [facility] CATH LAB;  Service: Cardiovascular;  Laterality: N/A;  . REPLACEMENT TOTAL KNEE  2004   Right knee  . TONSILLECTOMY    . TOTAL ABDOMINAL HYSTERECTOMY W/ BILATERAL SALPINGOOPHORECTOMY    . TRICUSPID VALVE REPLACEMENT  01/04/2012   Procedure: TRICUSPID VALVE REPAIR;  Surgeon: Purcell Nails, MD;  Location: [facility] OR;  Service: Open Heart Surgery;  Laterality: N/A;     OB History   No obstetric history on file.      Home Medications    Prior to Admission  medications   Medication Sig Start Date End Date Taking? Authorizing Provider  ALPRAZolam (XANAX) 0.25 MG tablet Take 0.25 mg by mouth at bedtime as needed.  02/05/12   [provider]  atorvastatin (LIPITOR) 40 MG tablet Take 40 mg by mouth at bedtime.     [provider]  dicyclomine (BENTYL) 20 MG tablet Take 20 mg by mouth 2 (two) times daily.     [provider]  ferrous sulfate 325 (65 FE) MG tablet Take 325 mg by mouth daily with breakfast.    [provider]  furosemide (LASIX) 20 MG tablet Take 20 mg by mouth daily.    [provider]  gemfibrozil (LOPID) 600 MG tablet Take 600 mg by mouth 2 (two) times daily before a meal.    [provider]  HYDROcodone-acetaminophen (NORCO) 7.5-325 MG tablet Take 1 tablet by mouth every 6 (six) hours as needed for moderate pain. 12/08/18   Benjiman Core, MD  losartan (COZAAR) 100 MG tablet Take 100 mg by mouth daily.     [provider]  metoprolol succinate (TOPROL-XL) 25 MG 24 hr tablet Take 25 mg by mouth at bedtime.    [provider]  saxagliptin HCl (ONGLYZA) 5 MG TABS tablet Take 5 mg by mouth daily.    [provider]  polysaccharide iron (NIFEREX) 150 MG CAPS capsule Take 150 mg by mouth daily.  12/29/11  [provider]    Family History Family History  Problem Relation Age of Onset  . Anesthesia problems Neg Hx     Social History Social History   Tobacco Use  . Smoking status: Never Smoker  . Smokeless tobacco: Former Neurosurgeon    Types: Chew  Substance Use Topics  . Alcohol use: No  . Drug use: No     Allergies   Aspirin and Sudafed [pseudoephedrine hcl]   Review of Systems Review of Systems  Constitutional: Negative for appetite change.  HENT: Negative for congestion.   Respiratory: Negative for shortness of breath.   Cardiovascular: Negative for chest pain.  Gastrointestinal: Negative for abdominal pain.  Genitourinary: Negative  for flank pain.  Musculoskeletal: Positive for back pain.  Skin: Negative for rash and wound.  Neurological: Negative for weakness and numbness.  Psychiatric/Behavioral: Negative for confusion.     Physical Exam Updated Vital Signs BP 126/76 (BP Location: Left Arm)   Pulse 78   Temp 97.9 F (36.6 C) (Oral)   Resp 16   Wt 71.7 kg   SpO2 96%   BMI 28.90 kg/m   Physical Exam HENT:     Head: Atraumatic.     Mouth/Throat:     Mouth: Mucous membranes are moist.  Neck:     Musculoskeletal: Neck supple.  Chest:     Chest wall: No tenderness.  Abdominal:     Tenderness: There is no abdominal tenderness.  Musculoskeletal:     Comments: Tenderness over lower lumbar and sacral area.  No deformity.  Good range of motion in hips.  Some radiation down left leg with straight leg raise on left.  Sensation and strength grossly intact in left lower extremity.  Skin:    Capillary Refill: Capillary refill takes less than 2 seconds.  Neurological:     Mental Status: She is alert. Mental status is at baseline.      ED Treatments / Results  Labs (all labs ordered are listed, but only abnormal results are displayed) Labs Reviewed - No data to display  EKG None  Radiology Ct Lumbar Spine Wo Contrast  Result Date: 12/08/2018 CLINICAL DATA:  Fall 3 weeks ago. Lower leg weakness. Radiculopathy. EXAM: CT LUMBAR SPINE WITHOUT CONTRAST TECHNIQUE: Multidetector CT imaging of the lumbar spine was performed without intravenous contrast administration. Multiplanar CT image reconstructions were also generated. COMPARISON:  Lumbar radiographs 12/06/2018 FINDINGS: Segmentation: Normal Alignment: Normal Vertebrae: T12 superior endplate fracture appears chronic. No acute fracture or mass. Paraspinal and other soft tissues: Atherosclerotic aorta without aneurysm. No paraspinous mass or edema. Disc levels: T12-L1: Mild disc and mild facet degeneration. Negative for stenosis L1-2: Moderate disc degeneration.  Bilateral facet degeneration. Negative for stenosis L2-3: Diffuse bulging of the disc with moderate facet degeneration. Mild spinal stenosis L3-4: Diffuse bulging of the disc with small central disc protrusion. Moderate facet hypertrophy bilaterally. Mild spinal stenosis L4-5: Central and left-sided disc protrusion. Subarticular stenosis on the left with possible impingement left L5 nerve root. Mild to moderate spinal stenosis. Bilateral facet degeneration. L5-S1: Disc degeneration and spurring left greater than right. Bilateral facet hypertrophy. Moderate subarticular stenosis on the left with expected impingement left S1 nerve root. IMPRESSION: Chronic compression fracture T12.  No acute fracture Multilevel degenerative change throughout the lumbar spine as detailed above Subarticular stenosis on the left at L4-5 and L5-S1. Electronically Signed   By: Marlan Palau M.D.   On: 12/08/2018 11:53   Ct Pelvis Wo Contrast  Result Date: 12/08/2018 CLINICAL DATA:  Persistent low back pain and lower leg weakness since fall 3 weeks ago. EXAM: CT PELVIS WITHOUT CONTRAST TECHNIQUE: Multidetector CT imaging of the pelvis was performed following the standard protocol without intravenous contrast. COMPARISON:  Left hip x-rays from same day. FINDINGS: Urinary Tract:  No abnormality visualized. Bowel:  Moderate stool in the visualized colon and rectum. Vascular/Lymphatic: Aortoiliac atherosclerosis. No enlarged lymph nodes. Reproductive:  Prior hysterectomy.  No adnexal mass. Other:  No free fluid. Musculoskeletal: Nondisplaced, minimally depressed fractures of the anterior bilateral sacral ala. Nondisplaced fracture of the S3 vertebral body. Mild bilateral hip osteoarthritis. IMPRESSION: 1. Nondisplaced, minimally depressed fractures of the anterior bilateral sacral ala. 2. Nondisplaced fracture of the S3 vertebral body. Electronically Signed   By: Obie Dredge M.D.   On: 12/08/2018 12:20    Procedures Procedures  (including critical care time)  Medications Ordered in ED Medications - No data to display   Initial Impression / Assessment and Plan / ED Course  I have reviewed the triage vital signs and the nursing notes.  Pertinent labs & imaging results that were available during my care of the patient were reviewed by me and considered in my medical decision making (see chart for details).        Patient with fall 3 weeks ago.  Has 3 nondisplaced sacral fractures.  Weight-bear as tolerated.  Will increase the  patient's hydrocodone slightly.  Patient and her son think this will be enough to help her be able to manage at home.  Will need to follow with PCP.  Final Clinical Impressions(s) / ED Diagnoses   Final diagnoses:  Closed fracture of sacrum, unspecified portion of sacrum, initial encounter [facility])    ED Discharge Orders         Ordered    HYDROcodone-acetaminophen (NORCO) 7.5-325 MG tablet  Every 6 hours PRN     12/08/18 1258           Benjiman Core, MD 12/08/18 1259

## 2019-03-03 IMAGING — CT CT PELVIS W/O CM
3 series · 13 of 35 positions shown, 16 images · non-contrast
Comparison: Left hip x-rays from same day.

CLINICAL DATA: Persistent low back pain and lower leg weakness
since fall 3 weeks ago.

EXAM:
CT PELVIS WITHOUT CONTRAST
TECHNIQUE: Multidetector CT imaging of the pelvis was performed following the
standard protocol without intravenous contrast.

[Series 6: axial soft tissue · axial · 0.75mm/px · z∈[-370,-188]mm · 5 of 133 slices shown, 7 images]
[im 21/133  soft-tissue]
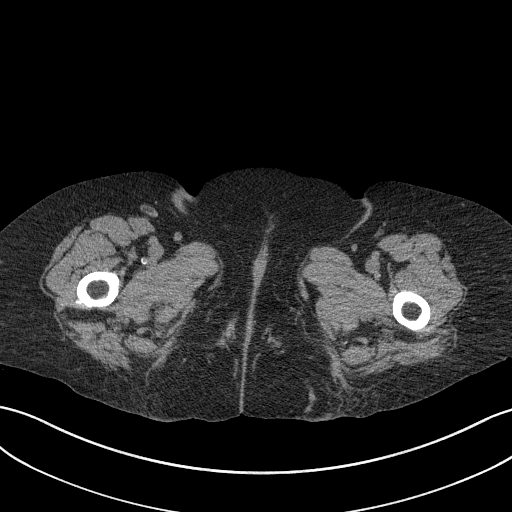
[im 21/133  bone]
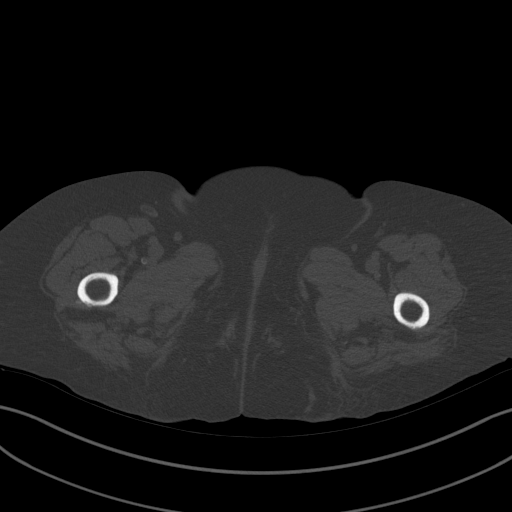
[im 41/133  bone]
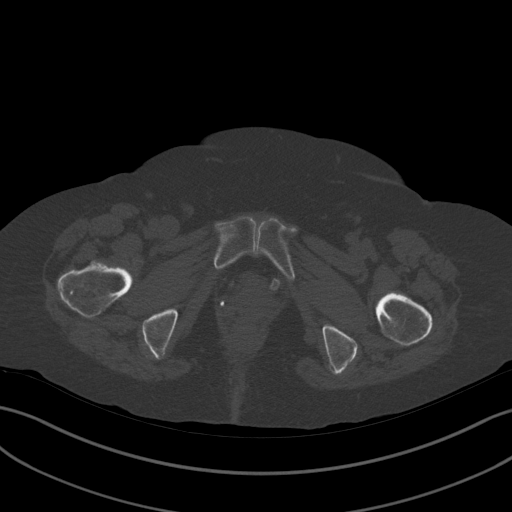
[im 72/133  bone]
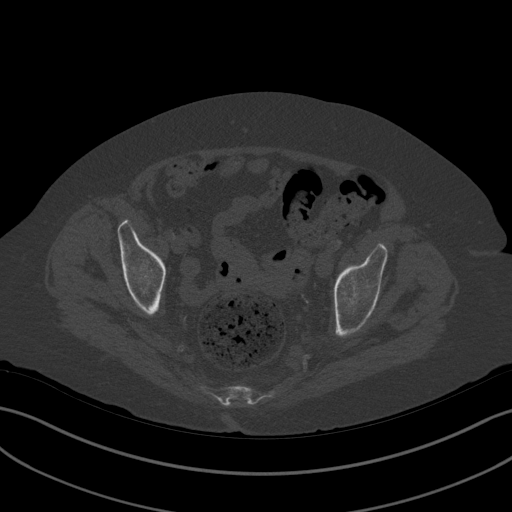
[im 92/133  bone]
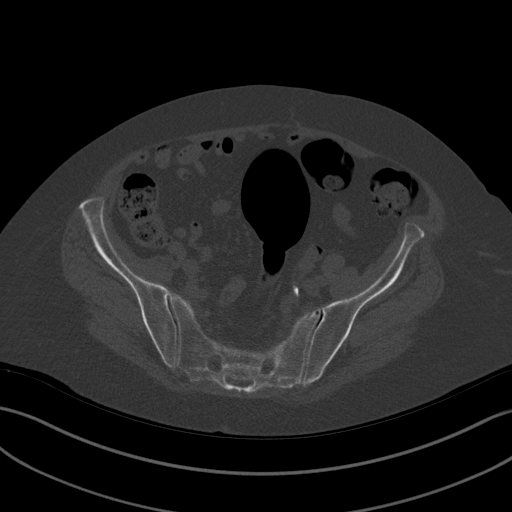
[im 112/133  soft-tissue]
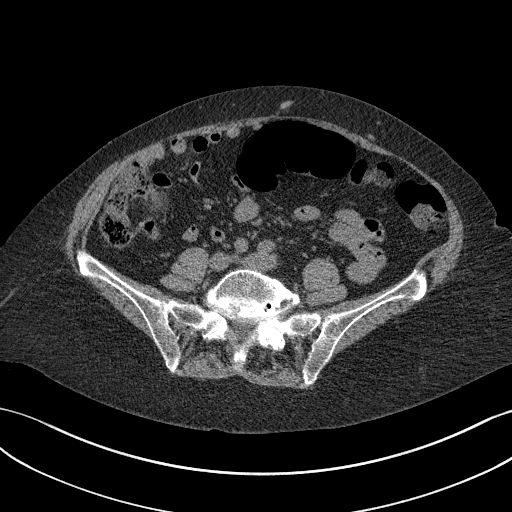
[im 112/133  bone]
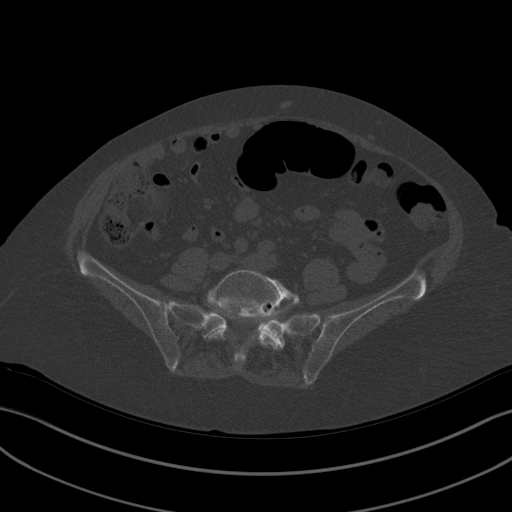

[Series 8: coronal st · coronal · 0.52mm/px · 3 of 116 slices shown]
[im 24/116  bone]
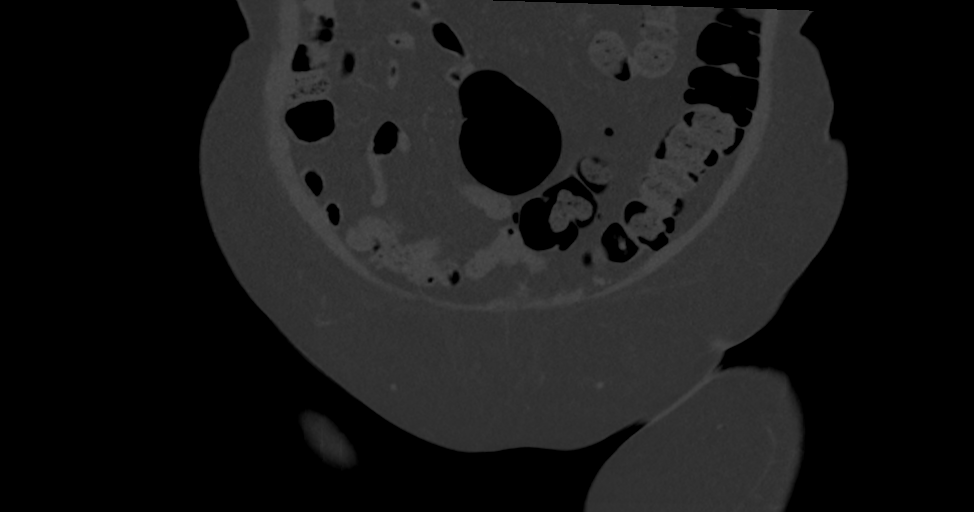
[im 47/116  bone]
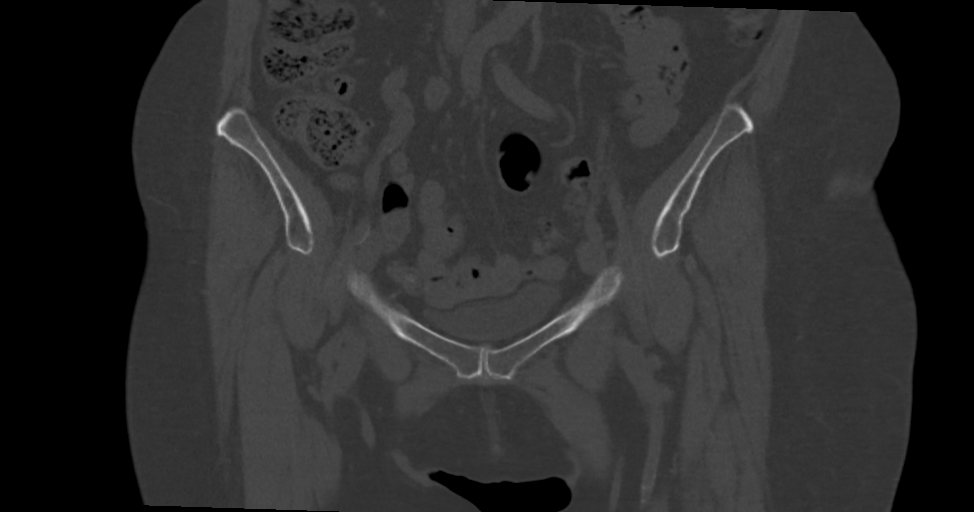
[im 70/116  bone]
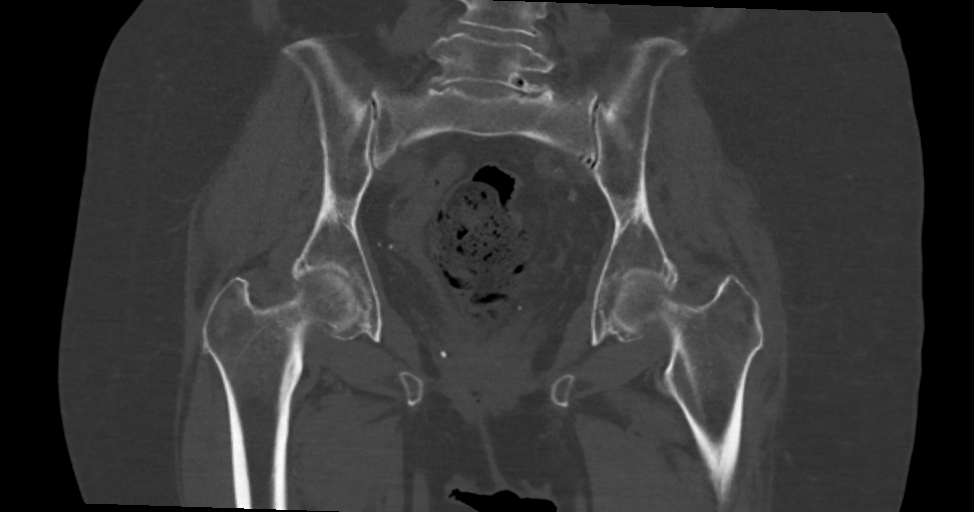

[Series 10: sagittal st · sagittal · 0.45mm/px · 5 of 130 slices shown, 6 images]
[im 44/130  bone]
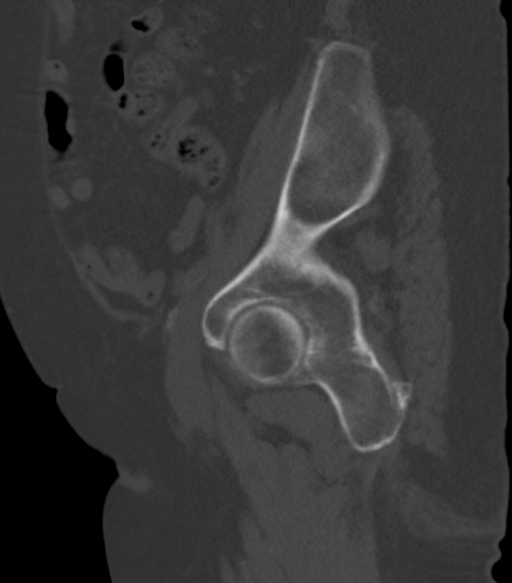
[im 54/130  bone]
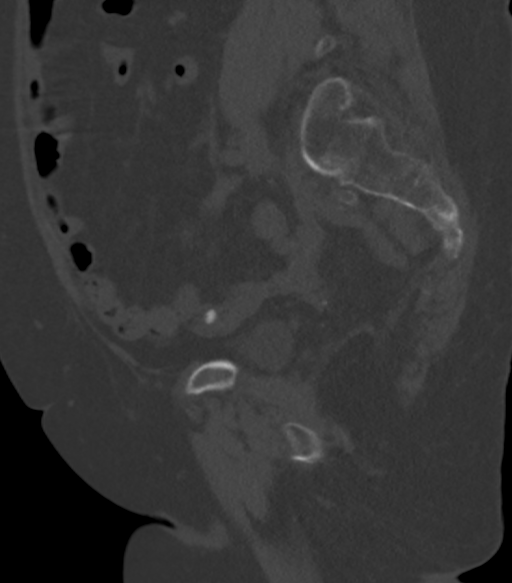
[im 65/130  soft-tissue]
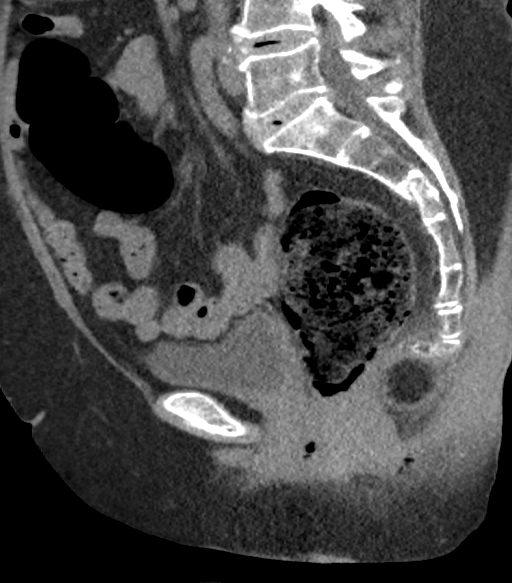
[im 65/130  bone]
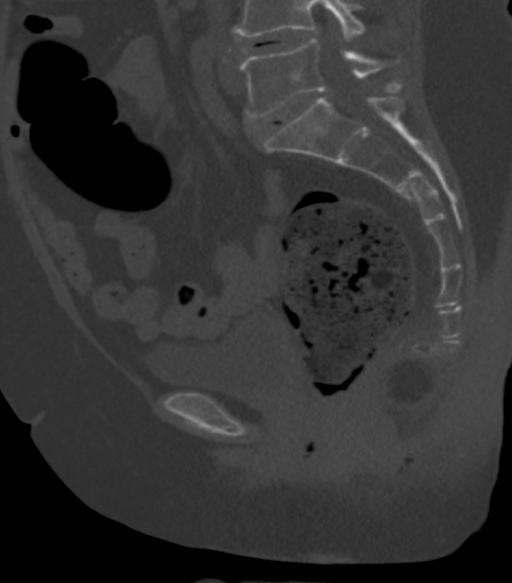
[im 76/130  bone]
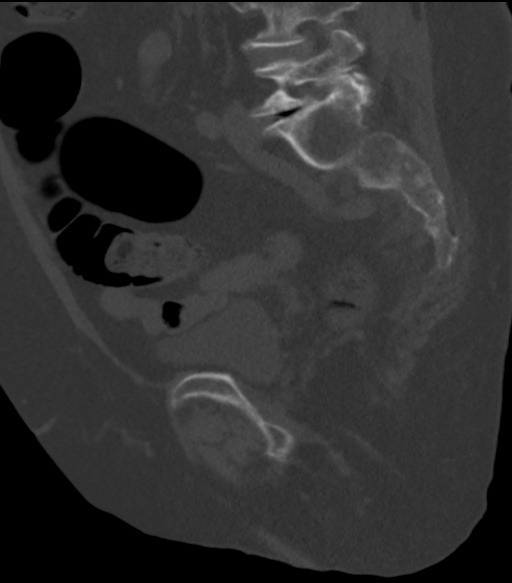
[im 87/130  bone]
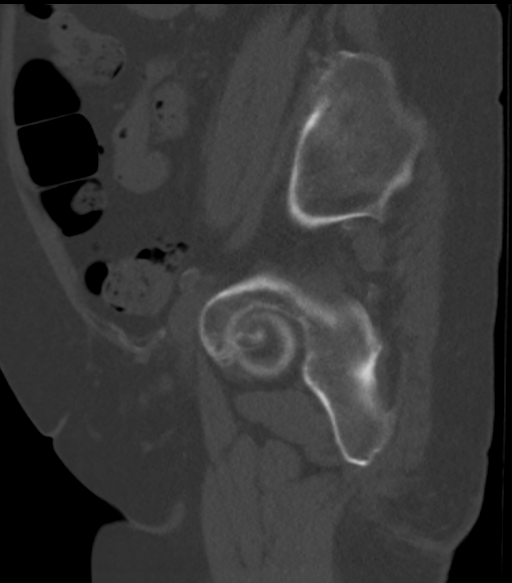

[13 of 35 positions shown; findings below may reference images not displayed]

FINDINGS: Urinary Tract:  No abnormality visualized.

Bowel:  Moderate stool in the visualized colon and rectum.

Vascular/Lymphatic: Aortoiliac atherosclerosis. No enlarged lymph
nodes.

Reproductive:  Prior hysterectomy.  No adnexal mass.

Other:  No free fluid.

Musculoskeletal: Nondisplaced, minimally depressed fractures of the
anterior bilateral sacral ala. Nondisplaced fracture of the S3
vertebral body. Mild bilateral hip osteoarthritis.
IMPRESSION: 1. Nondisplaced, minimally depressed fractures of the anterior
bilateral sacral ala.
2. Nondisplaced fracture of the S3 vertebral body.

## 2019-03-03 IMAGING — CT CT L SPINE W/O CM
3 series · 11 of 33 positions shown, 13 images · non-contrast
Comparison: Lumbar radiographs 12/06/2018

CLINICAL DATA: Fall 3 weeks ago. Lower leg weakness. Radiculopathy.

EXAM:
CT LUMBAR SPINE WITHOUT CONTRAST
TECHNIQUE: Multidetector CT imaging of the lumbar spine was performed without
intravenous contrast administration. Multiplanar CT image
reconstructions were also generated.

[Series 4: l spine soft · axial · 0.34mm/px · z∈[-193,-43]mm · 3 of 123 slices shown, 4 images]
[im 29/123  soft-tissue]
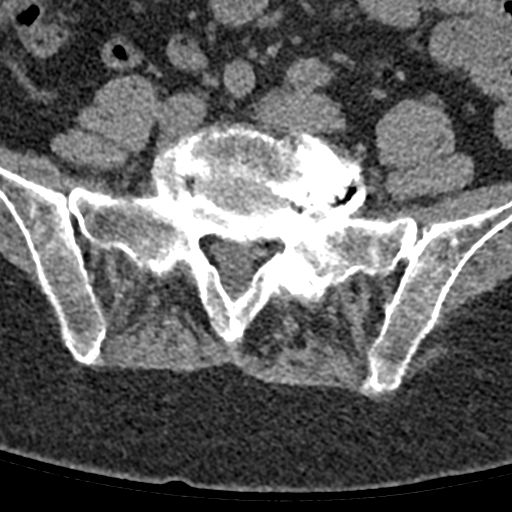
[im 29/123  bone]
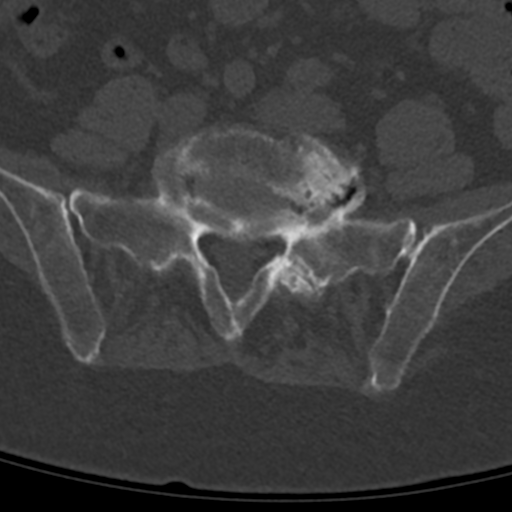
[im 66/123  bone]
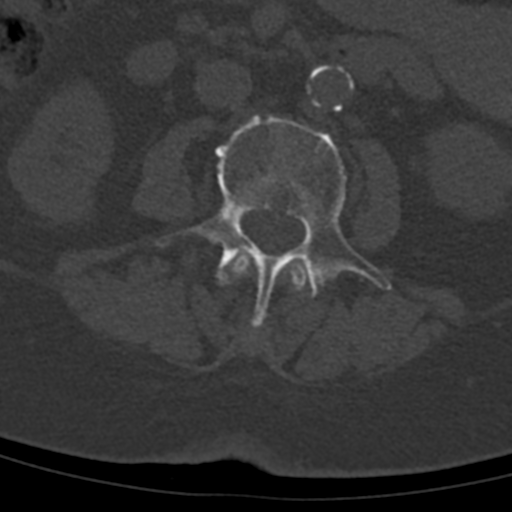
[im 104/123  bone]
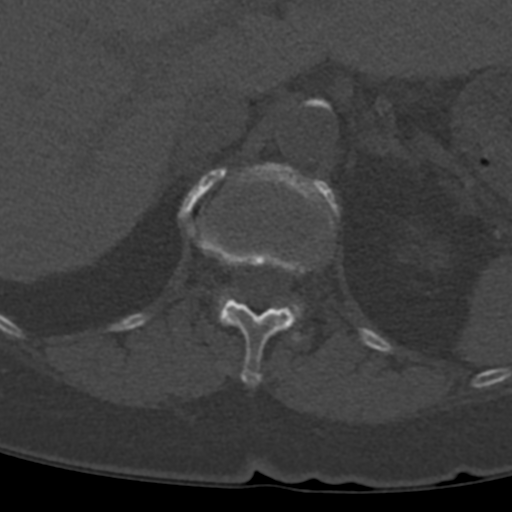

[Series 7: sagittal bone · sagittal · 0.33mm/px · 5 of 75 slices shown, 6 images]
[im 25/75  bone]
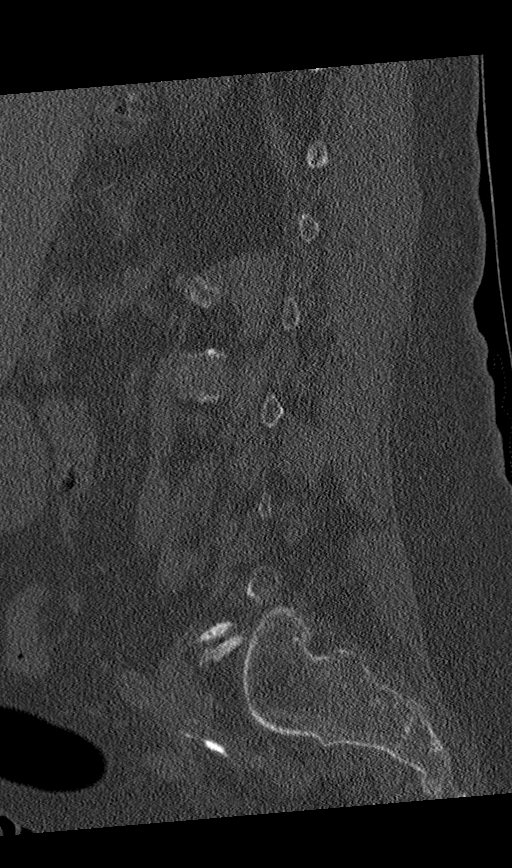
[im 31/75  bone]
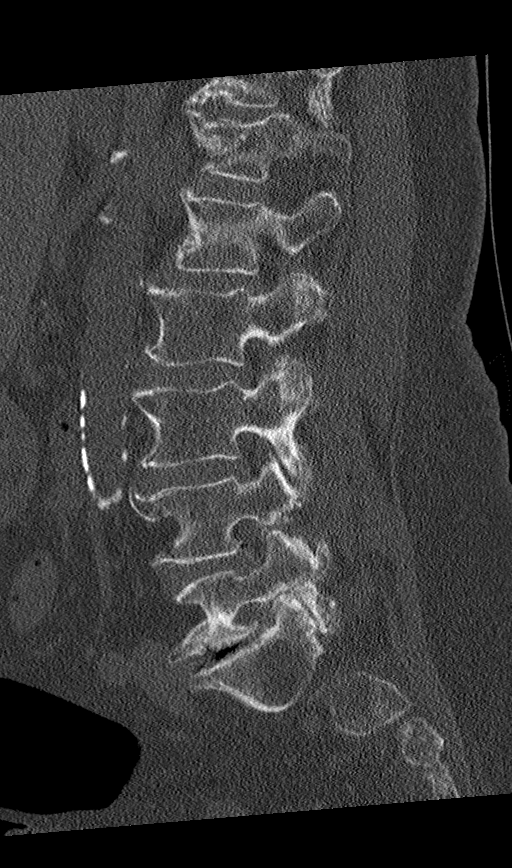
[im 38/75  soft-tissue]
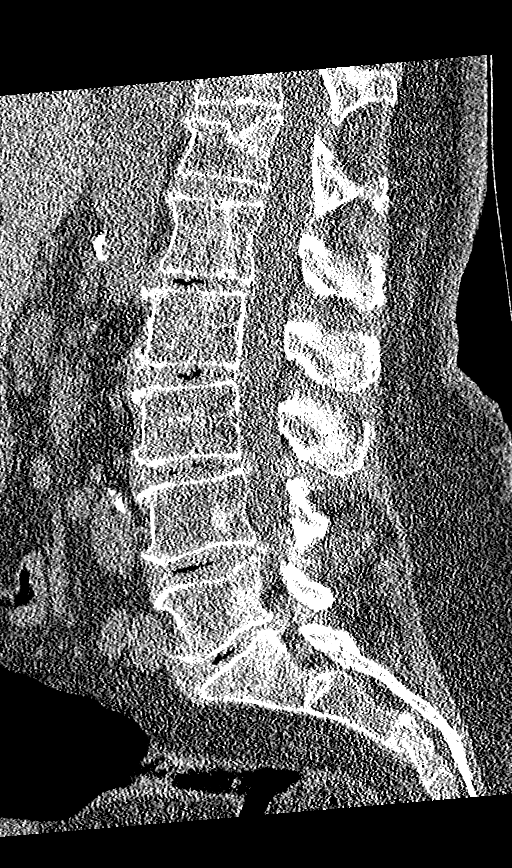
[im 38/75  bone]
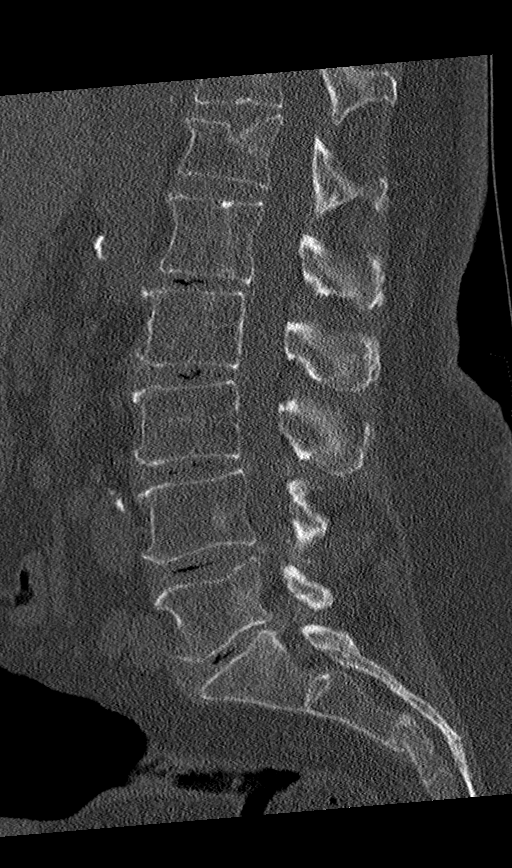
[im 44/75  bone]
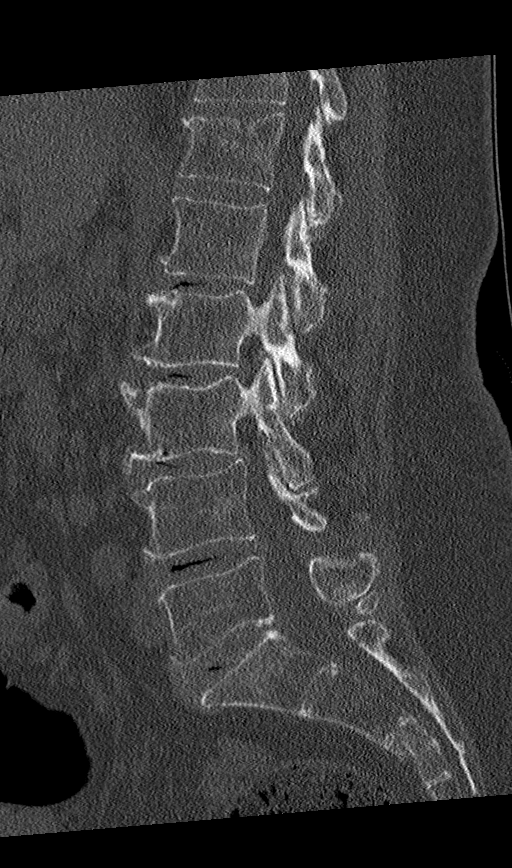
[im 50/75  bone]
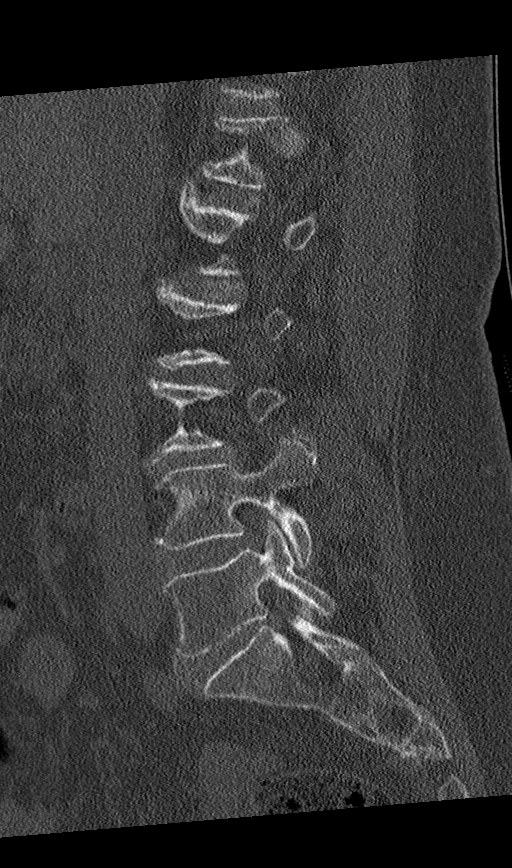

[Series 8: coronal bone · coronal · 0.29mm/px · 3 of 86 slices shown]
[im 18/86  bone]
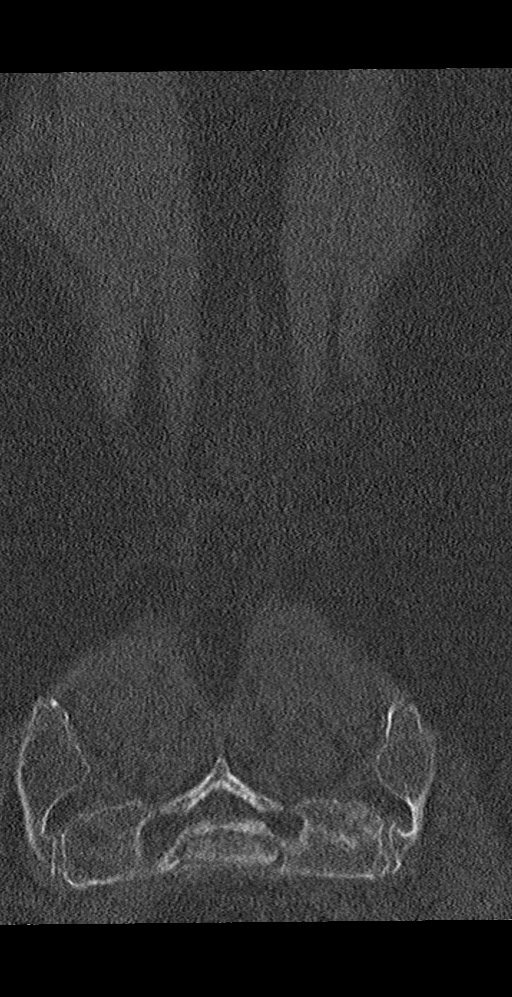
[im 35/86  bone]
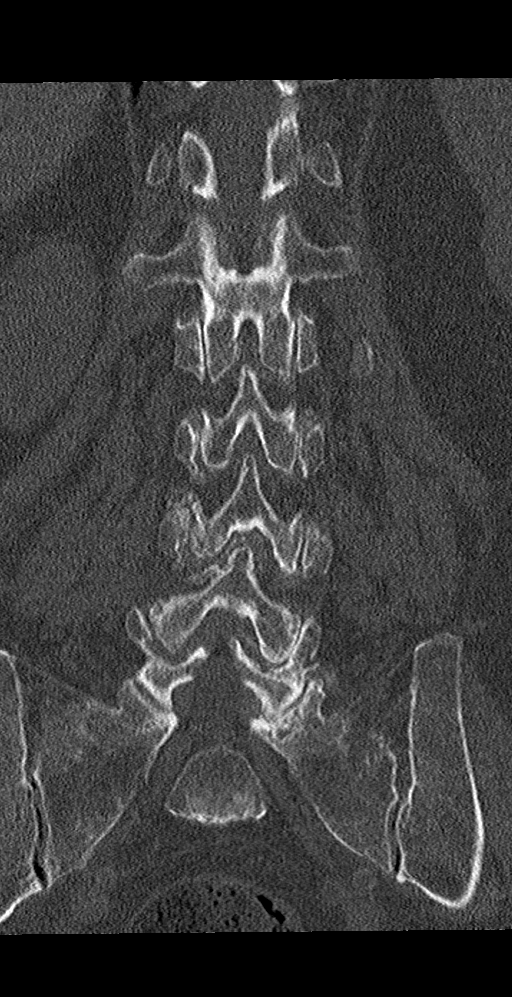
[im 52/86  bone]
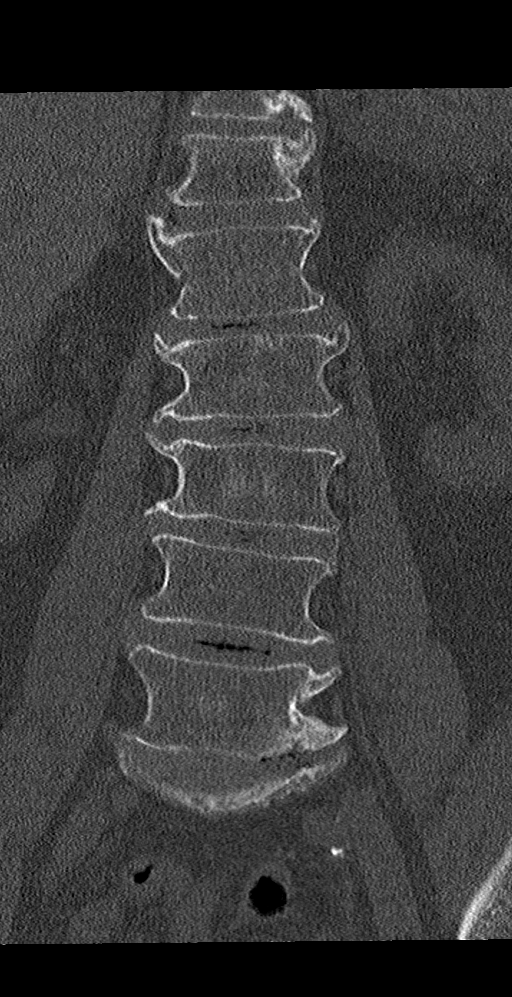

[11 of 33 positions shown; findings below may reference images not displayed]

FINDINGS: Segmentation: Normal

Alignment: Normal

Vertebrae: T12 superior endplate fracture appears chronic. No acute
fracture or mass.

Paraspinal and other soft tissues: Atherosclerotic aorta without
aneurysm. No paraspinous mass or edema.

Disc levels: T12-L1: Mild disc and mild facet degeneration. Negative
for stenosis

L1-2: Moderate disc degeneration. Bilateral facet degeneration.
Negative for stenosis

L2-3: Diffuse bulging of the disc with moderate facet degeneration.
Mild spinal stenosis

L3-4: Diffuse bulging of the disc with small central disc
protrusion. Moderate facet hypertrophy bilaterally. Mild spinal
stenosis

L4-5: Central and left-sided disc protrusion. Subarticular stenosis
on the left with possible impingement left L5 nerve root. Mild to
moderate spinal stenosis. Bilateral facet degeneration.

L5-S1: Disc degeneration and spurring left greater than right.
Bilateral facet hypertrophy. Moderate subarticular stenosis on the
left with expected impingement left S1 nerve root.
IMPRESSION: Chronic compression fracture T12.  No acute fracture

Multilevel degenerative change throughout the lumbar spine as
detailed above

Subarticular stenosis on the left at L4-5 and L5-S1.

## 2019-10-17 DEATH — deceased
